# Patient Record
Sex: Female | Born: 1950 | Race: White | Hispanic: No | Marital: Married | Smoking: Never smoker
Health system: Southern US, Community
[De-identification: ages and names within clinical notes are randomized; demographics above are authoritative.]

## PROBLEM LIST (undated history)

## (undated) DIAGNOSIS — E7800 Pure hypercholesterolemia, unspecified: Secondary | ICD-10-CM

## (undated) HISTORY — PX: ABDOMINAL HYSTERECTOMY: SHX81

## (undated) HISTORY — DX: Pure hypercholesterolemia, unspecified: E78.00

---

## 2000-05-13 LAB — HM COLONOSCOPY

## 2004-12-14 LAB — HM COLONOSCOPY

## 2012-06-15 ENCOUNTER — Encounter (INDEPENDENT_AMBULATORY_CARE_PROVIDER_SITE_OTHER): Payer: BC Managed Care – PPO | Admitting: Ophthalmology

## 2012-06-15 DIAGNOSIS — H3540 Unspecified peripheral retinal degeneration: Secondary | ICD-10-CM

## 2012-06-15 DIAGNOSIS — H43819 Vitreous degeneration, unspecified eye: Secondary | ICD-10-CM

## 2012-06-15 DIAGNOSIS — H25049 Posterior subcapsular polar age-related cataract, unspecified eye: Secondary | ICD-10-CM

## 2013-03-05 LAB — HM COLONOSCOPY

## 2020-02-19 ENCOUNTER — Ambulatory Visit (INDEPENDENT_AMBULATORY_CARE_PROVIDER_SITE_OTHER): Payer: Medicare Other | Admitting: Family Medicine

## 2020-02-19 ENCOUNTER — Other Ambulatory Visit: Payer: Self-pay

## 2020-02-19 ENCOUNTER — Encounter: Payer: Self-pay | Admitting: Family Medicine

## 2020-02-19 VITALS — BP 128/73 | HR 73 | Temp 98.4°F | Ht 63.0 in | Wt 140.6 lb

## 2020-02-19 DIAGNOSIS — Z13228 Encounter for screening for other metabolic disorders: Secondary | ICD-10-CM

## 2020-02-19 DIAGNOSIS — Z13220 Encounter for screening for lipoid disorders: Secondary | ICD-10-CM | POA: Diagnosis not present

## 2020-02-19 DIAGNOSIS — Z7689 Persons encountering health services in other specified circumstances: Secondary | ICD-10-CM | POA: Diagnosis not present

## 2020-02-19 DIAGNOSIS — Z1159 Encounter for screening for other viral diseases: Secondary | ICD-10-CM

## 2020-02-19 DIAGNOSIS — N952 Postmenopausal atrophic vaginitis: Secondary | ICD-10-CM

## 2020-02-19 NOTE — Progress Notes (Signed)
Subjective: CC: est care, atrophic vaginitis PCP: Janora Norlander, DO EEF:EOFHQRF Kristine Keller is a 69 y.o. female presenting to clinic today for:  1.  Atrophic vaginitis Patient was formally cared for by Dr. Lindwood Qua, who has since retired.  She is on topical estradiol cream vaginally.  She notes that she takes this once a week and it typically controls urethritis and vaginal discharge fairly well.  She is on no other medications.  She has history of partial hysterectomy which spared her ovaries.  She has history of fibrocystic changes in the breast.   ROS: Per HPI  Allergies  Allergen Reactions  . Codeine Other (See Comments)  . Sulfa Antibiotics Anaphylaxis   History reviewed. No pertinent past medical history.  Current Outpatient Medications:  .  estradiol (ESTRACE) 0.1 MG/GM vaginal cream, FINGER APPLICATION TO THE VAGINA AT BEDTIME AS DIRECTED, Disp: , Rfl:  Social History   Socioeconomic History  . Marital status: Married    Spouse name: Richard  . Number of children: 3  . Years of education: 10  . Highest education level: 10th grade  Occupational History  . Occupation: retired  Tobacco Use  . Smoking status: Never Smoker  . Smokeless tobacco: Never Used  Vaping Use  . Vaping Use: Never used  Substance and Sexual Activity  . Alcohol use: Never  . Drug use: Never  . Sexual activity: Not Currently  Other Topics Concern  . Not on file  Social History Narrative  . Not on file   Social Determinants of Health   Financial Resource Strain:   . Difficulty of Paying Living Expenses:   Food Insecurity:   . Worried About Charity fundraiser in the Last Year:   . Arboriculturist in the Last Year:   Transportation Needs:   . Film/video editor (Medical):   Marland Kitchen Lack of Transportation (Non-Medical):   Physical Activity:   . Days of Exercise per Week:   . Minutes of Exercise per Session:   Stress:   . Feeling of Stress :   Social Connections:   . Frequency of  Communication with Friends and Family:   . Frequency of Social Gatherings with Friends and Family:   . Attends Religious Services:   . Active Member of Clubs or Organizations:   . Attends Archivist Meetings:   Marland Kitchen Marital Status:   Intimate Partner Violence:   . Fear of Current or Ex-Partner:   . Emotionally Abused:   Marland Kitchen Physically Abused:   . Sexually Abused:    Family History  Problem Relation Age of Onset  . Cancer Mother        lung, kidney, cervical  . Hypertension Mother   . COPD Father   . COPD Brother   . Hypertension Brother   . Cancer Brother        kidney  . Autoimmune disease Daughter     Objective: Office vital signs reviewed. BP 128/73   Pulse 73   Temp 98.4 F (36.9 C)   Ht '5\' 3"'  (1.6 m)   Wt 140 lb 9.6 oz (63.8 kg)   SpO2 97%   BMI 24.91 kg/m   Physical Examination:  General: Awake, alert, well nourished, No acute distress HEENT: Normal, sclera white, MMM; no carotid bruits Cardio: regular rate and rhythm, S1S2 heard, no murmurs appreciated Pulm: clear to auscultation bilaterally, no wheezes, rhonchi or rales; normal work of breathing on room air Extremities: warm, well perfused, No edema, cyanosis  or clubbing; +2 pulses bilaterally MSK: normal gait and station Skin: dry; intact; no rashes or lesions Neuro: Alert and oriented x3.  No focal neurologic deficits PSych: Mood stable, speech normal, affect appropriate, pleasant and interactive.  Does not appear to be responding to internal stimuli. Depression screen PHQ 2/9 02/19/2020  Decreased Interest 0  Down, Depressed, Hopeless 0  PHQ - 2 Score 0     Assessment/ Plan: 69 y.o. female   1. Atrophic vaginitis No refills needed of the estrogen cream.  She will contact me if she needs refills going forward  2. Establishing care with new doctor, encounter for  3. Screening for metabolic disorder Fasting labs ordered. - CMP14+EGFR; Future - TSH; Future  4. Screening, lipid - Lipid  panel; Future  5. Encounter for hepatitis C screening test for low risk patient Could not find previous hepatitis C screen in EMR.  Will obtain per CDC guidelines with future labs. - Hepatitis C antibody; Future  No orders of the defined types were placed in this encounter.  No orders of the defined types were placed in this encounter.    Janora Norlander, DO [place] [phone]

## 2020-02-19 NOTE — Patient Instructions (Signed)
Come in fasting for labs.

## 2020-02-21 ENCOUNTER — Other Ambulatory Visit: Payer: Self-pay

## 2020-02-21 ENCOUNTER — Other Ambulatory Visit: Payer: Medicare Other

## 2020-02-21 DIAGNOSIS — Z13220 Encounter for screening for lipoid disorders: Secondary | ICD-10-CM

## 2020-02-21 DIAGNOSIS — Z1159 Encounter for screening for other viral diseases: Secondary | ICD-10-CM

## 2020-02-21 DIAGNOSIS — Z13228 Encounter for screening for other metabolic disorders: Secondary | ICD-10-CM

## 2020-02-22 LAB — CMP14+EGFR
ALT: 19 IU/L (ref 0–32)
AST: 24 IU/L (ref 0–40)
Albumin/Globulin Ratio: 1.7 (ref 1.2–2.2)
Albumin: 4.6 g/dL (ref 3.8–4.8)
Alkaline Phosphatase: 51 IU/L (ref 48–121)
BUN/Creatinine Ratio: 11 — ABNORMAL LOW (ref 12–28)
BUN: 10 mg/dL (ref 8–27)
Bilirubin Total: 0.4 mg/dL (ref 0.0–1.2)
CO2: 26 mmol/L (ref 20–29)
Calcium: 9.7 mg/dL (ref 8.7–10.3)
Chloride: 102 mmol/L (ref 96–106)
Creatinine, Ser: 0.91 mg/dL (ref 0.57–1.00)
GFR calc Af Amer: 75 mL/min/{1.73_m2} (ref >59)
GFR calc non Af Amer: 65 mL/min/{1.73_m2} (ref >59)
Globulin, Total: 2.7 g/dL (ref 1.5–4.5)
Glucose: 90 mg/dL (ref 65–99)
Potassium: 4 mmol/L (ref 3.5–5.2)
Sodium: 139 mmol/L (ref 134–144)
Total Protein: 7.3 g/dL (ref 6.0–8.5)

## 2020-02-22 LAB — LIPID PANEL
Chol/HDL Ratio: 4.3 ratio (ref 0.0–4.4)
Cholesterol, Total: 242 mg/dL — ABNORMAL HIGH (ref 100–199)
HDL: 56 mg/dL (ref >39)
LDL Chol Calc (NIH): 165 mg/dL — ABNORMAL HIGH (ref 0–99)
Triglycerides: 120 mg/dL (ref 0–149)
VLDL Cholesterol Cal: 21 mg/dL (ref 5–40)

## 2020-02-22 LAB — HEPATITIS C ANTIBODY: Hep C Virus Ab: <0.1 s/co ratio (ref 0.0–0.9)

## 2020-02-22 LAB — TSH: TSH: 2.71 u[IU]/mL (ref 0.450–4.500)

## 2020-02-27 ENCOUNTER — Telehealth: Payer: Self-pay | Admitting: Family Medicine

## 2020-02-27 NOTE — Telephone Encounter (Signed)
FYI: Pt called [facility] regarding lab results. Pt says she wants to try lowering her cholesterol on her own before starting medication. Pt says she will start taking fish oil and do better with diet and exercise to see if that will help. Pt scheduled a 3 mth follow up visit with Dr Nadine Counts on 05/07/20.

## 2020-02-28 ENCOUNTER — Other Ambulatory Visit: Payer: Self-pay | Admitting: Family Medicine

## 2020-02-28 DIAGNOSIS — Z1231 Encounter for screening mammogram for malignant neoplasm of breast: Secondary | ICD-10-CM

## 2020-03-26 ENCOUNTER — Ambulatory Visit
Admission: RE | Admit: 2020-03-26 | Discharge: 2020-03-26 | Disposition: A | Discharge status: Home / Self Care | Payer: Medicare Other | Source: Ambulatory Visit | Attending: Family Medicine | Admitting: Family Medicine

## 2020-03-26 ENCOUNTER — Other Ambulatory Visit: Payer: Self-pay

## 2020-03-26 DIAGNOSIS — Z1231 Encounter for screening mammogram for malignant neoplasm of breast: Secondary | ICD-10-CM

## 2020-05-07 ENCOUNTER — Ambulatory Visit: Payer: Medicare Other | Admitting: Family Medicine

## 2021-09-15 ENCOUNTER — Ambulatory Visit (INDEPENDENT_AMBULATORY_CARE_PROVIDER_SITE_OTHER): Payer: Medicare Other

## 2021-09-15 VITALS — Ht 63.0 in | Wt 135.0 lb

## 2021-09-15 DIAGNOSIS — Z0000 Encounter for general adult medical examination without abnormal findings: Secondary | ICD-10-CM

## 2021-09-15 DIAGNOSIS — Z1231 Encounter for screening mammogram for malignant neoplasm of breast: Secondary | ICD-10-CM

## 2021-09-15 NOTE — Progress Notes (Signed)
Subjective:   Kristine Keller is a 71 y.o. female who presents for Medicare Annual (Subsequent) preventive examination.  Virtual Visit via Telephone Note  I connected with  Kristine Keller on 09/15/21 at  9:00 AM EST by telephone and verified that I am speaking with the correct person using two identifiers.  Location: Patient: Home Provider: [facility] Persons participating in the virtual visit: patient/Nurse Health Advisor   I discussed the limitations, risks, security and privacy concerns of performing an evaluation and management service by telephone and the availability of in person appointments. The patient expressed understanding and agreed to proceed.  Interactive audio and video telecommunications were attempted between this nurse and patient, however failed, due to patient having technical difficulties OR patient did not have access to video capability.  We continued and completed visit with audio only.  Some vital signs may be absent or patient reported.   Kristine Keller E Kristine Sweetland, LPN   Review of Systems     Cardiac Risk Factors include: advanced age (>53men, >67 women);dyslipidemia     Objective:    Today's Vitals   09/15/21 0858  Weight: 135 lb (61.2 kg)  Height: 5\' 3"  (1.6 m)   Body mass index is 23.91 kg/m.  Advanced Directives 09/15/2021  Does Patient Have a Medical Advance Directive? Yes  Type of Paramedic of [place];Living will  Copy of [place] in Chart? No - copy requested    Current Medications (verified) Outpatient Encounter Medications as of 09/15/2021  Medication Sig   estradiol (ESTRACE) 0.1 MG/GM vaginal cream FINGER APPLICATION TO THE VAGINA AT BEDTIME AS DIRECTED   No facility-administered encounter medications on file as of 09/15/2021.    Allergies (verified) Codeine and Sulfa antibiotics   History: History reviewed. No pertinent past medical history. Past Surgical History:  Procedure Laterality Date    ABDOMINAL HYSTERECTOMY     Family History  Problem Relation Age of Onset   Cancer Mother        lung, kidney, cervical   Hypertension Mother    COPD Father    COPD Brother    Hypertension Brother    Cancer Brother        kidney   Autoimmune disease Daughter    Social History   Socioeconomic History   Marital status: Married    Spouse name: Kristine Keller   Number of children: 3   Years of education: 10   Highest education level: 10th grade  Occupational History   Occupation: retired  Tobacco Use   Smoking status: Never   Smokeless tobacco: Never  Vaping Use   Vaping Use: Never used  Substance and Sexual Activity   Alcohol use: Never   Drug use: Never   Sexual activity: Not Currently  Other Topics Concern   Not on file  Social History Narrative   Daughter, Kristine Keller   Her grandson and his 2 disabled children live with her (one with epilepsy, other with autism)-born 2016 & 2017   Social Determinants of Health   Financial Resource Strain: Low Risk    Difficulty of Paying Living Expenses: Not hard at all  Food Insecurity: No Food Insecurity   Worried About Charity fundraiser in the Last Year: Never true   Arboriculturist in the Last Year: Never true  Transportation Needs: No Transportation Needs   Lack of Transportation (Medical): No   Lack of Transportation (Non-Medical): No  Physical Activity: Sufficiently Active   Days of Exercise per Week:  5 days   Minutes of Exercise per Session: 30 min  Stress: No Stress Concern Present   Feeling of Stress : Not at all  Social Connections: Moderately Isolated   Frequency of Communication with Friends and Family: More than three times a week   Frequency of Social Gatherings with Friends and Family: Once a week   Attends Religious Services: Never   Marine scientist or Organizations: No   Attends Music therapist: Never   Marital Status: Married    Tobacco Counseling Counseling given: Not Answered   Clinical  Intake:  Pre-visit preparation completed: Yes  Pain : No/denies pain     BMI - recorded: 23.91 Nutritional Status: BMI of 19-24  Normal Nutritional Risks: None Diabetes: No  How often do you need to have someone help you when you read instructions, pamphlets, or other written materials from your doctor or pharmacy?: 1 - Never  Diabetic? no  Interpreter Needed?: No  Information entered by :: Gurshan Settlemire, LPN   Activities of Daily Living In your present state of health, do you have any difficulty performing the following activities: 09/15/2021  Hearing? N  Vision? N  Difficulty concentrating or making decisions? N  Walking or climbing stairs? N  Dressing or bathing? N  Doing errands, shopping? N  Preparing Food and eating ? N  Using the Toilet? N  In the past six months, have you accidently leaked urine? N  Do you have problems with loss of bowel control? N  Managing your Medications? N  Managing your Finances? N  Housekeeping or managing your Housekeeping? N  Some recent data might be hidden    Patient Care Team: Janora Norlander, DO as PCP - General (Family Medicine)  Indicate any recent Medical Services you may have received from other than [facility] providers in the past year (date may be approximate).     Assessment:   This is a routine wellness examination for Kristine Keller.  Hearing/Vision screen Hearing Screening - Comments:: Denies hearing difficulties  Vision Screening - Comments:: Wears rx glasses- up to date with annual eye exams with Kristine Keller at [facility] issues and exercise activities discussed: Current Exercise Habits: Home exercise routine, Type of exercise: walking;Other - see comments (stays busy and active caring for great grandchildren), Time (Minutes): 30, Frequency (Times/Week): 5, Weekly Exercise (Minutes/Week): 150, Intensity: Mild, Exercise limited by: None identified   Goals Addressed             This Visit's Progress     Exercise 150 min/wk Moderate Activity         Depression Screen PHQ 2/9 Scores 09/15/2021 02/19/2020  PHQ - 2 Score 0 0    Fall Risk Fall Risk  09/15/2021 02/19/2020  Falls in the past year? 0 0  Number falls in past yr: 0 -  Injury with Fall? 0 -  Risk for fall due to : No Fall Risks -  Follow up Falls prevention discussed -    FALL [place]:  Any stairs in or around the home? Yes  If so, are there any without handrails? No  Home free of loose throw rugs in walkways, pet beds, electrical cords, etc? Yes  Adequate lighting in your home to reduce risk of falls? Yes   ASSISTIVE DEVICES UTILIZED TO PREVENT FALLS:  Life alert? No  Use of a cane, walker or w/c? No  Grab bars in the bathroom? No  Shower chair  or bench in shower? No  Elevated toilet seat or a handicapped toilet? No   TIMED UP AND GO:  Was the test performed? No . Telephonic visit.  Cognitive Function:     6CIT Screen 09/15/2021  What Year? 0 points  What month? 0 points  What time? 0 points  Count back from 20 0 points  Months in reverse 0 points  Repeat phrase 0 points  Total Score 0    Immunizations Immunization History  Administered Date(s) Administered   Unspecified SARS-COV-2 Vaccination 06/30/2020, 07/28/2020    TDAP status: Due, Education has been provided regarding the importance of this vaccine. Advised may receive this vaccine at local pharmacy or Health Dept. Aware to provide a copy of the vaccination record if obtained from local pharmacy or Health Dept. Verbalized acceptance and understanding.  Flu Vaccine status: Declined, Education has been provided regarding the importance of this vaccine but patient still declined. Advised may receive this vaccine at local pharmacy or Health Dept. Aware to provide a copy of the vaccination record if obtained from local pharmacy or Health Dept. Verbalized acceptance and understanding.  Pneumococcal vaccine status: Declined,   Education has been provided regarding the importance of this vaccine but patient still declined. Advised may receive this vaccine at local pharmacy or Health Dept. Aware to provide a copy of the vaccination record if obtained from local pharmacy or Health Dept. Verbalized acceptance and understanding.   Covid-19 vaccine status: Completed vaccines  Qualifies for Shingles Vaccine? Yes   Zostavax completed No   Shingrix Completed?: No.    Education has been provided regarding the importance of this vaccine. Patient has been advised to call insurance company to determine out of pocket expense if they have not yet received this vaccine. Advised may also receive vaccine at local pharmacy or Health Dept. Verbalized acceptance and understanding.  Screening Tests Health Maintenance  Topic Date Due   TETANUS/TDAP  Never done   COLONOSCOPY (Pts 45-37yrs Insurance coverage will need to be confirmed)  Never done   Zoster Vaccines- Shingrix (1 of 2) Never done   Pneumonia Vaccine 35+ Years old (1 - PCV) Never done   DEXA SCAN  Never done   COVID-19 Vaccine (3 - Booster) 09/22/2020   INFLUENZA VACCINE  Never done   MAMMOGRAM  03/26/2022   Hepatitis C Screening  Completed   HPV VACCINES  Aged Out    Health Maintenance  Health Maintenance Due  Topic Date Due   TETANUS/TDAP  Never done   COLONOSCOPY (Pts 45-45yrs Insurance coverage will need to be confirmed)  Never done   Zoster Vaccines- Shingrix (1 of 2) Never done   Pneumonia Vaccine 63+ Years old (1 - PCV) Never done   DEXA SCAN  Never done   COVID-19 Vaccine (3 - Booster) 09/22/2020   INFLUENZA VACCINE  Never done    Colorectal cancer screening: due - will discuss with PCP  Mammogram status: Ordered 08/2021. Pt provided with contact info and advised to call to schedule appt.   Bone Density scan: Due - will discuss with PCP  Lung Cancer Screening: (Low Dose CT Chest recommended if Age 59-80 years, 30 pack-year currently smoking OR have  quit w/in 15years.) does not qualify  Additional Screening:  Hepatitis C Screening: does qualify; Completed 02/21/2020  Vision Screening: Recommended annual ophthalmology exams for early detection of glaucoma and other disorders of the eye. Is the patient up to date with their annual eye exam?  Yes  Who is  the provider or what is the name of the office in which the patient attends annual eye exams? Kristine Keller If pt is not established with a provider, would they like to be referred to a provider to establish care? No .   Dental Screening: Recommended annual dental exams for proper oral hygiene  Community Resource Referral / Chronic Care Management: CRR required this visit?  No   CCM required this visit?  No      Plan:     I have personally reviewed and noted the following in the patients chart:   Medical and social history Use of alcohol, tobacco or illicit drugs  Current medications and supplements including opioid prescriptions.  Functional ability and status Nutritional status Physical activity Advanced directives List of other physicians Hospitalizations, surgeries, and ER visits in previous 12 months Vitals Screenings to include cognitive, depression, and falls Referrals and appointments  In addition, I have reviewed and discussed with patient certain preventive protocols, quality metrics, and best practice recommendations. A written personalized care plan for preventive services as well as general preventive health recommendations were provided to patient.     Sandrea Hammond, LPN   [id number]   Nurse Notes: Is past due for several vaccins and screenings - says she will discuss and possibly consider at next visit.

## 2021-09-15 NOTE — Patient Instructions (Signed)
Kristine Keller , Thank you for taking time to come for your Medicare Wellness Visit. I appreciate your ongoing commitment to your health goals. Please review the following plan we discussed and let me know if I can assist you in the future.   Screening recommendations/referrals: Colonoscopy: due Mammogram: Done 03/26/2020 - Repeat annually *due - call for appointment in February Bone Density: Due Recommended yearly ophthalmology/optometry visit for glaucoma screening and checkup Recommended yearly dental visit for hygiene and checkup  Vaccinations: Influenza vaccine: Due Pneumococcal vaccine: Due Tdap vaccine: Due Shingles vaccine: Due   Covid-19: Done x2 06/2020  Advanced directives: Please bring a copy of your health care power of attorney and living will to the office to be added to your chart at your convenience.   Conditions/risks identified: Aim for 30 minutes of exercise or brisk walking each day, drink 6-8 glasses of water and eat lots of fruits and vegetables.   Next appointment: Follow up in one year for your annual wellness visit    Preventive Care 65 Years and Older, Female Preventive care refers to lifestyle choices and visits with your health care provider that can promote health and wellness. What does preventive care include? A yearly physical exam. This is also called an annual well check. Dental exams once or twice a year. Routine eye exams. Ask your health care provider how often you should have your eyes checked. Personal lifestyle choices, including: Daily care of your teeth and gums. Regular physical activity. Eating a healthy diet. Avoiding tobacco and drug use. Limiting alcohol use. Practicing safe sex. Taking low-dose aspirin every day. Taking vitamin and mineral supplements as recommended by your health care provider. What happens during an annual well check? The services and screenings done by your health care provider during your annual well check will  depend on your age, overall health, lifestyle risk factors, and family history of disease. Counseling  Your health care provider may ask you questions about your: Alcohol use. Tobacco use. Drug use. Emotional well-being. Home and relationship well-being. Sexual activity. Eating habits. History of falls. Memory and ability to understand (cognition). Work and work Astronomer. Reproductive health. Screening  You may have the following tests or measurements: Height, weight, and BMI. Blood pressure. Lipid and cholesterol levels. These may be checked every 5 years, or more frequently if you are over 37 years old. Skin check. Lung cancer screening. You may have this screening every year starting at age 35 if you have a 30-pack-year history of smoking and currently smoke or have quit within the past 15 years. Fecal occult blood test (FOBT) of the stool. You may have this test every year starting at age 56. Flexible sigmoidoscopy or colonoscopy. You may have a sigmoidoscopy every 5 years or a colonoscopy every 10 years starting at age 76. Hepatitis C blood test. Hepatitis B blood test. Sexually transmitted disease (STD) testing. Diabetes screening. This is done by checking your blood sugar (glucose) after you have not eaten for a while (fasting). You may have this done every 1-3 years. Bone density scan. This is done to screen for osteoporosis. You may have this done starting at age 50. Mammogram. This may be done every 1-2 years. Talk to your health care provider about how often you should have regular mammograms. Talk with your health care provider about your test results, treatment options, and if necessary, the need for more tests. Vaccines  Your health care provider may recommend certain vaccines, such as: Influenza vaccine. This is recommended  every year. Tetanus, diphtheria, and acellular pertussis (Tdap, Td) vaccine. You may need a Td booster every 10 years. Zoster vaccine. You may  need this after age 62. Pneumococcal 13-valent conjugate (PCV13) vaccine. One dose is recommended after age 56. Pneumococcal polysaccharide (PPSV23) vaccine. One dose is recommended after age 6. Talk to your health care provider about which screenings and vaccines you need and how often you need them. This information is not intended to replace advice given to you by your health care provider. Make sure you discuss any questions you have with your health care provider. Document Released: 09/12/2015 Document Revised: 05/05/2016 Document Reviewed: 06/17/2015 Elsevier Interactive Patient Education  2017 [place] Prevention in the Home Falls can cause injuries. They can happen to people of all ages. There are many things you can do to make your home safe and to help prevent falls. What can I do on the outside of my home? Regularly fix the edges of walkways and driveways and fix any cracks. Remove anything that might make you trip as you walk through a door, such as a raised step or threshold. Trim any bushes or trees on the path to your home. Use bright outdoor lighting. Clear any walking paths of anything that might make someone trip, such as rocks or tools. Regularly check to see if handrails are loose or broken. Make sure that both sides of any steps have handrails. Any raised decks and porches should have guardrails on the edges. Have any leaves, snow, or ice cleared regularly. Use sand or salt on walking paths during winter. Clean up any spills in your garage right away. This includes oil or grease spills. What can I do in the bathroom? Use night lights. Install grab bars by the toilet and in the tub and shower. Do not use towel bars as grab bars. Use non-skid mats or decals in the tub or shower. If you need to sit down in the shower, use a plastic, non-slip stool. Keep the floor dry. Clean up any water that spills on the floor as soon as it happens. Remove soap buildup in  the tub or shower regularly. Attach bath mats securely with double-sided non-slip rug tape. Do not have throw rugs and other things on the floor that can make you trip. What can I do in the bedroom? Use night lights. Make sure that you have a light by your bed that is easy to reach. Do not use any sheets or blankets that are too big for your bed. They should not hang down onto the floor. Have a firm chair that has side arms. You can use this for support while you get dressed. Do not have throw rugs and other things on the floor that can make you trip. What can I do in the kitchen? Clean up any spills right away. Avoid walking on wet floors. Keep items that you use a lot in easy-to-reach places. If you need to reach something above you, use a strong step stool that has a grab bar. Keep electrical cords out of the way. Do not use floor polish or wax that makes floors slippery. If you must use wax, use non-skid floor wax. Do not have throw rugs and other things on the floor that can make you trip. What can I do with my stairs? Do not leave any items on the stairs. Make sure that there are handrails on both sides of the stairs and use them. Fix handrails that are broken  or loose. Make sure that handrails are as long as the stairways. Check any carpeting to make sure that it is firmly attached to the stairs. Fix any carpet that is loose or worn. Avoid having throw rugs at the top or bottom of the stairs. If you do have throw rugs, attach them to the floor with carpet tape. Make sure that you have a light switch at the top of the stairs and the bottom of the stairs. If you do not have them, ask someone to add them for you. What else can I do to help prevent falls? Wear shoes that: Do not have high heels. Have rubber bottoms. Are comfortable and fit you well. Are closed at the toe. Do not wear sandals. If you use a stepladder: Make sure that it is fully opened. Do not climb a closed  stepladder. Make sure that both sides of the stepladder are locked into place. Ask someone to hold it for you, if possible. Clearly mark and make sure that you can see: Any grab bars or handrails. First and last steps. Where the edge of each step is. Use tools that help you move around (mobility aids) if they are needed. These include: Canes. Walkers. Scooters. Crutches. Turn on the lights when you go into a dark area. Replace any light bulbs as soon as they burn out. Set up your furniture so you have a clear path. Avoid moving your furniture around. If any of your floors are uneven, fix them. If there are any pets around you, be aware of where they are. Review your medicines with your doctor. Some medicines can make you feel dizzy. This can increase your chance of falling. Ask your doctor what other things that you can do to help prevent falls. This information is not intended to replace advice given to you by your health care provider. Make sure you discuss any questions you have with your health care provider. Document Released: 06/12/2009 Document Revised: 01/22/2016 Document Reviewed: 09/20/2014 Elsevier Interactive Patient Education  2017 [employer].

## 2021-09-28 ENCOUNTER — Telehealth: Payer: Self-pay | Admitting: Family Medicine

## 2021-09-28 ENCOUNTER — Other Ambulatory Visit: Payer: Self-pay | Admitting: Family Medicine

## 2021-09-28 DIAGNOSIS — E7800 Pure hypercholesterolemia, unspecified: Secondary | ICD-10-CM

## 2021-09-28 NOTE — Telephone Encounter (Signed)
done

## 2021-09-28 NOTE — Telephone Encounter (Signed)
PT AWARE LABS ORDERED

## 2021-10-19 ENCOUNTER — Other Ambulatory Visit: Payer: Medicare Other

## 2021-10-19 DIAGNOSIS — E7800 Pure hypercholesterolemia, unspecified: Secondary | ICD-10-CM | POA: Diagnosis not present

## 2021-10-20 LAB — LIPID PANEL
Chol/HDL Ratio: 4.2 ratio (ref 0.0–4.4)
Cholesterol, Total: 230 mg/dL — ABNORMAL HIGH (ref 100–199)
HDL: 55 mg/dL (ref >39)
LDL Chol Calc (NIH): 156 mg/dL — ABNORMAL HIGH (ref 0–99)
Triglycerides: 106 mg/dL (ref 0–149)
VLDL Cholesterol Cal: 19 mg/dL (ref 5–40)

## 2021-10-20 LAB — CMP14+EGFR
ALT: 16 IU/L (ref 0–32)
AST: 24 IU/L (ref 0–40)
Albumin/Globulin Ratio: 1.7 (ref 1.2–2.2)
Albumin: 4.3 g/dL (ref 3.8–4.8)
Alkaline Phosphatase: 49 IU/L (ref 44–121)
BUN/Creatinine Ratio: 13 (ref 12–28)
BUN: 11 mg/dL (ref 8–27)
Bilirubin Total: 0.5 mg/dL (ref 0.0–1.2)
CO2: 24 mmol/L (ref 20–29)
Calcium: 9.4 mg/dL (ref 8.7–10.3)
Chloride: 104 mmol/L (ref 96–106)
Creatinine, Ser: 0.87 mg/dL (ref 0.57–1.00)
Globulin, Total: 2.5 g/dL (ref 1.5–4.5)
Glucose: 91 mg/dL (ref 70–99)
Potassium: 4.1 mmol/L (ref 3.5–5.2)
Sodium: 141 mmol/L (ref 134–144)
Total Protein: 6.8 g/dL (ref 6.0–8.5)
eGFR: 72 mL/min/{1.73_m2} (ref >59)

## 2021-10-20 LAB — TSH: TSH: 1.85 u[IU]/mL (ref 0.450–4.500)

## 2021-10-21 ENCOUNTER — Ambulatory Visit (INDEPENDENT_AMBULATORY_CARE_PROVIDER_SITE_OTHER): Payer: Medicare Other | Admitting: Family Medicine

## 2021-10-21 ENCOUNTER — Encounter: Payer: Self-pay | Admitting: Family Medicine

## 2021-10-21 ENCOUNTER — Ambulatory Visit
Admission: RE | Admit: 2021-10-21 | Discharge: 2021-10-21 | Disposition: A | Discharge status: Home / Self Care | Payer: Medicare Other | Source: Ambulatory Visit | Attending: Family Medicine | Admitting: Family Medicine

## 2021-10-21 ENCOUNTER — Other Ambulatory Visit: Payer: Self-pay

## 2021-10-21 VITALS — BP 145/86 | HR 81 | Temp 98.0°F | Ht 63.0 in | Wt 141.6 lb

## 2021-10-21 DIAGNOSIS — Z5320 Procedure and treatment not carried out because of patient's decision for unspecified reasons: Secondary | ICD-10-CM | POA: Diagnosis not present

## 2021-10-21 DIAGNOSIS — Z1231 Encounter for screening mammogram for malignant neoplasm of breast: Secondary | ICD-10-CM | POA: Diagnosis not present

## 2021-10-21 DIAGNOSIS — Z2821 Immunization not carried out because of patient refusal: Secondary | ICD-10-CM

## 2021-10-21 DIAGNOSIS — R030 Elevated blood-pressure reading, without diagnosis of hypertension: Secondary | ICD-10-CM | POA: Diagnosis not present

## 2021-10-21 DIAGNOSIS — E7800 Pure hypercholesterolemia, unspecified: Secondary | ICD-10-CM

## 2021-10-21 DIAGNOSIS — Z0000 Encounter for general adult medical examination without abnormal findings: Secondary | ICD-10-CM

## 2021-10-21 NOTE — Progress Notes (Signed)
Kristine Keller is a 71 y.o. female presents to office today for annual physical exam examination.    Concerns today include: 1. None  Diet: Balanced, Exercise: Stays active.  Has 2 great-grandchildren that reside with her with special needs Last colonoscopy: declines Last mammogram: declines Last pap smear: n/a Refills needed today: none Immunizations needed: Immunization History  Administered Date(s) Administered   Unspecified SARS-COV-2 Vaccination 06/30/2020, 07/28/2020   History reviewed. No pertinent past medical history. Social History   Socioeconomic History   Marital status: Married    Spouse name: Richard   Number of children: 3   Years of education: 10   Highest education level: 10th grade  Occupational History   Occupation: retired  Tobacco Use   Smoking status: Never   Smokeless tobacco: Never  Vaping Use   Vaping Use: Never used  Substance and Sexual Activity   Alcohol use: Never   Drug use: Never   Sexual activity: Not Currently  Other Topics Concern   Not on file  Social History Narrative   Daughter, Judeen Hammans   Her grandson and his 2 disabled children live with her (one with epilepsy, other with autism)-born 2016 & 2017   Social Determinants of Health   Financial Resource Strain: Low Risk    Difficulty of Paying Living Expenses: Not hard at all  Food Insecurity: No Food Insecurity   Worried About Charity fundraiser in the Last Year: Never true   Arboriculturist in the Last Year: Never true  Transportation Needs: No Transportation Needs   Lack of Transportation (Medical): No   Lack of Transportation (Non-Medical): No  Physical Activity: Sufficiently Active   Days of Exercise per Week: 5 days   Minutes of Exercise per Session: 30 min  Stress: No Stress Concern Present   Feeling of Stress : Not at all  Social Connections: Moderately Isolated   Frequency of Communication with Friends and Family: More than three times a week   Frequency of Social  Gatherings with Friends and Family: Once a week   Attends Religious Services: Never   Marine scientist or Organizations: No   Attends Music therapist: Never   Marital Status: Married  Human resources officer Violence: Not At Risk   Fear of Current or Ex-Partner: No   Emotionally Abused: No   Physically Abused: No   Sexually Abused: No   Past Surgical History:  Procedure Laterality Date   ABDOMINAL HYSTERECTOMY     Family History  Problem Relation Age of Onset   Cancer Mother        lung, kidney, cervical   Hypertension Mother    COPD Father    COPD Brother    Hypertension Brother    Cancer Brother        kidney   Autoimmune disease Daughter     Current Outpatient Medications:    estradiol (ESTRACE) 0.1 MG/GM vaginal cream, FINGER APPLICATION TO THE VAGINA AT BEDTIME AS DIRECTED (Patient not taking: Reported on 10/21/2021), Disp: , Rfl:   Allergies  Allergen Reactions   Codeine Other (See Comments)   Sulfa Antibiotics Anaphylaxis     ROS: Review of Systems A comprehensive review of systems was negative.    Physical exam BP (!) 145/86    Pulse 81    Temp 98 F (36.7 C)    Ht '5\' 3"'  (1.6 m)    Wt 141 lb 9.6 oz (64.2 kg)    SpO2 96%  BMI 25.08 kg/m  General appearance: alert, cooperative, appears stated age, and no distress Head: Normocephalic, without obvious abnormality, atraumatic Eyes: negative findings: lids and lashes normal, conjunctivae and sclerae normal, corneas clear, and pupils equal, round, reactive to light and accomodation Ears: normal TM's and external ear canals both ears Nose: Nares normal. Septum midline. Mucosa normal. No drainage or sinus tenderness. Throat:  Oropharynx without erythema or masses Neck: no adenopathy, no carotid bruit, supple, symmetrical, trachea midline, and thyroid not enlarged, symmetric, no tenderness/mass/nodules Back: symmetric, no curvature. ROM normal. No CVA tenderness. Lungs: clear to auscultation  bilaterally Heart: regular rate and rhythm, S1, S2 normal, no murmur, click, rub or gallop Abdomen: soft, non-tender; bowel sounds normal; no masses,  no organomegaly Extremities: extremities normal, atraumatic, no cyanosis or edema Pulses: 2+ and symmetric Skin: Skin color, texture, turgor normal. No rashes or lesions Lymph nodes: Cervical, supraclavicular, and axillary nodes normal. Neurologic: Alert and oriented X 3, normal strength and tone. Normal symmetric reflexes. Normal coordination and gait Psych: Mood stable, speech normal, affect appropriate  The 10-year ASCVD risk score (Arnett DK, et al., 2019) is: 11.3%   Values used to calculate the score:     Age: 7 years     Sex: Female     Is Non-Hispanic African American: No     Diabetic: No     Tobacco smoker: No     Systolic Blood Pressure: 585 mmHg     Is BP treated: No     HDL Cholesterol: 55 mg/dL     Total Cholesterol: 230 mg/dL  Results for orders placed or performed in visit on 10/19/21 (from the past 72 hour(s))  TSH     Status: None   Collection Time: 10/19/21  8:31 AM  Result Value Ref Range   TSH 1.850 0.450 - 4.500 uIU/mL  Lipid panel     Status: Abnormal   Collection Time: 10/19/21  8:31 AM  Result Value Ref Range   Cholesterol, Total 230 (H) 100 - 199 mg/dL   Triglycerides 106 0 - 149 mg/dL   HDL 55 >39 mg/dL   VLDL Cholesterol Cal 19 5 - 40 mg/dL   LDL Chol Calc (NIH) 156 (H) 0 - 99 mg/dL   Chol/HDL Ratio 4.2 0.0 - 4.4 ratio    Comment:                                   T. Chol/HDL Ratio                                             Men  Women                               1/2 Avg.Risk  3.4    3.3                                   Avg.Risk  5.0    4.4                                2X Avg.Risk  9.6    7.1  3X Avg.Risk 23.4   11.0   CMP14+EGFR     Status: None   Collection Time: 10/19/21  8:31 AM  Result Value Ref Range   Glucose 91 70 - 99 mg/dL   BUN 11 8 - 27 mg/dL    Creatinine, Ser 0.87 0.57 - 1.00 mg/dL   eGFR 72 >59 mL/min/1.73   BUN/Creatinine Ratio 13 12 - 28   Sodium 141 134 - 144 mmol/L   Potassium 4.1 3.5 - 5.2 mmol/L   Chloride 104 96 - 106 mmol/L   CO2 24 20 - 29 mmol/L   Calcium 9.4 8.7 - 10.3 mg/dL   Total Protein 6.8 6.0 - 8.5 g/dL   Albumin 4.3 3.8 - 4.8 g/dL   Globulin, Total 2.5 1.5 - 4.5 g/dL   Albumin/Globulin Ratio 1.7 1.2 - 2.2   Bilirubin Total 0.5 0.0 - 1.2 mg/dL   Alkaline Phosphatase 49 44 - 121 IU/L   AST 24 0 - 40 IU/L   ALT 16 0 - 32 IU/L     Assessment/ Plan: Benard Rink here for annual physical exam.   Pure hypercholesterolemia  Annual physical exam  Elevated blood pressure reading without diagnosis of hypertension  Colonoscopy refused  Vaccination refused by patient  ASCVD risk score is 11.3%.  Technically she does warrant cholesterol medication but I do wonder if this would be much reduced if her blood pressure was within normal range.  Her blood pressure was elevated today but I think this is secondary to anxiety surrounding mammogram.  She has that scheduled right after our appointment today.  I have asked that she return in 1 week for blood pressure recheck.  We will recalculate her ASCVD risk work at that time and if persistently above 7.5%, we need to strongly consider starting a statin or similar  She declines shingles vaccination, colonoscopy, pneumococcal vaccination and bone density scan.  Labs review. Patient given results already.   Patient to follow up in 1 year for annual exam or sooner if needed.  Eliab Closson M. Lajuana Ripple, DO

## 2021-10-21 NOTE — Patient Instructions (Signed)
The 10-year ASCVD risk score (Arnett DK, et al., 2019) is: 11.3%   Values used to calculate the score:     Age: 71 years     Sex: Female     Is Non-Hispanic African American: No     Diabetic: No     Tobacco smoker: No     Systolic Blood Pressure: 145 mmHg     Is BP treated: No     HDL Cholesterol: 55 mg/dL     Total Cholesterol: 230 mg/dL   Your 98-XQJJ risk of a stroke or heart attack is 11.3% based on today's blood pressure and your cholesterol levels.  I would like you to come back for a blood pressure recheck with the nurse next week or 2 and we can recalculate this.  Based on today's risk score you should be on a cholesterol medication but I do wonder if lowering the blood pressure would negate that needed.

## 2021-10-28 ENCOUNTER — Ambulatory Visit: Payer: Medicare Other

## 2022-07-13 ENCOUNTER — Telehealth: Payer: Self-pay | Admitting: Family Medicine

## 2022-07-13 ENCOUNTER — Other Ambulatory Visit: Payer: Self-pay | Admitting: Family Medicine

## 2022-07-13 DIAGNOSIS — N952 Postmenopausal atrophic vaginitis: Secondary | ICD-10-CM

## 2022-07-13 MED ORDER — ESTRADIOL 0.1 MG/GM VA CREA: TOPICAL_CREAM | VAGINAL | 99 refills | Status: DC

## 2022-07-13 NOTE — Telephone Encounter (Signed)
No problem-  Sent.

## 2022-07-13 NOTE — Telephone Encounter (Signed)
  Prescription Request  07/13/2022   What is the name of the medication or equipment? Estradiol Cream  Have you contacted your pharmacy to request a refill? YES  Which pharmacy would you like this sent to? [facility]  Says pharmacy has tried sending several requests to Dr Nadine Counts to refill this Rx. Says another doctor is who initially prescribed it but was told by Dr Nadine Counts that if she ever needed refills on it, to call and let her know and she would send in refills.

## 2022-07-13 NOTE — Telephone Encounter (Signed)
Pt aware.

## 2022-08-02 ENCOUNTER — Encounter: Payer: Self-pay | Admitting: Family Medicine

## 2022-08-02 ENCOUNTER — Other Ambulatory Visit (INDEPENDENT_AMBULATORY_CARE_PROVIDER_SITE_OTHER): Payer: Medicare Other

## 2022-08-02 ENCOUNTER — Ambulatory Visit (INDEPENDENT_AMBULATORY_CARE_PROVIDER_SITE_OTHER): Payer: Medicare Other | Admitting: Family Medicine

## 2022-08-02 DIAGNOSIS — M79632 Pain in left forearm: Secondary | ICD-10-CM

## 2022-08-02 DIAGNOSIS — M79645 Pain in left finger(s): Secondary | ICD-10-CM | POA: Diagnosis not present

## 2022-08-02 DIAGNOSIS — M7989 Other specified soft tissue disorders: Secondary | ICD-10-CM

## 2022-08-02 NOTE — Progress Notes (Signed)
Subjective:    Patient ID: Kristine Keller, female    DOB: 1951/07/16, 71 y.o.   MRN: [id number]   HPI: Kristine Keller is a 71 y.o. female presenting for falling 2 weeks ago. Hit arm and wrist on floor. Pain persists.  Left thumb hurts. There is a knot on the left arm. Hurts a lot to pick up things. Numbness  and weakness not present. 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      10/21/2021    8:42 AM 09/15/2021    9:03 AM 02/19/2020    1:56 PM  Depression screen PHQ 2/9  Decreased Interest 0 0 0  Down, Depressed, Hopeless 0 0 0  PHQ - 2 Score 0 0 0     Relevant past medical, surgical, family and social history reviewed and updated as indicated.  Interim medical history since our last visit reviewed. Allergies and medications reviewed and updated.  ROS:  Review of Systems  noncontributory Social History   Tobacco Use  Smoking Status Never  Smokeless Tobacco Never       Objective:     Wt Readings from Last 3 Encounters:  10/21/21 141 lb 9.6 oz (64.2 kg)  09/15/21 135 lb (61.2 kg)  02/19/20 140 lb 9.6 oz (63.8 kg)     Exam deferred. Pt. Harboring due to COVID 19. Phone visit performed.   Assessment & Plan:   1. Pain and swelling of left forearm   2. Pain of left thumb     No orders of the defined types were placed in this encounter.   Orders Placed This Encounter  Procedures   DG Forearm Left    Standing Status:   Future    Standing Expiration Date:   09/02/2022    Order Specific Question:   Reason for Exam (SYMPTOM  OR DIAGNOSIS REQUIRED)    Answer:   pain 2 weeks after a fall    Order Specific Question:   Preferred imaging location?    Answer:   Internal   DG Hand Complete Left    Standing Status:   Future    Standing Expiration Date:   09/02/2022    Order Specific Question:   Reason for Exam (SYMPTOM  OR DIAGNOSIS REQUIRED)    Answer:   pain 2 weeks after a fall    Order Specific Question:   Preferred imaging location?    Answer:   Internal       Diagnoses and all orders for this visit:  Pain and swelling of left forearm -     DG Forearm Left; Future -     DG Hand Complete Left; Future  Pain of left thumb -     DG Forearm Left; Future -     DG Hand Complete Left; Future    Virtual Visit via telephone Note  I discussed the limitations, risks, security and privacy concerns of performing an evaluation and management service by telephone and the availability of in person appointments. The patient was identified with two identifiers. Pt.expressed understanding and agreed to proceed. Pt. Is at home. Dr. Darlyn Read is in his office.  Follow Up Instructions:   I discussed the assessment and treatment plan with the patient. The patient was provided an opportunity to ask questions and all were answered. The patient agreed with the plan and demonstrated an understanding of the instructions.   The patient was advised to call back or seek an in-person evaluation if the symptoms worsen or  if the condition fails to improve as anticipated.   Total minutes including chart review and phone contact time: 7   Follow up plan: Return if symptoms worsen or fail to improve.  Claretta Fraise, MD [place]

## 2022-09-16 ENCOUNTER — Ambulatory Visit (INDEPENDENT_AMBULATORY_CARE_PROVIDER_SITE_OTHER): Payer: Medicare Other

## 2022-09-16 VITALS — Ht 63.0 in | Wt 133.0 lb

## 2022-09-16 DIAGNOSIS — Z0000 Encounter for general adult medical examination without abnormal findings: Secondary | ICD-10-CM | POA: Diagnosis not present

## 2022-09-16 NOTE — Progress Notes (Signed)
Subjective:   Kristine Keller is a 72 y.o. female who presents for Medicare Annual (Subsequent) preventive examination. I connected with  Kristine Keller on 09/16/22 by a audio enabled telemedicine application and verified that I am speaking with the correct person using two identifiers.  Patient Location: Home  Provider Location: Home Office  I discussed the limitations of evaluation and management by telemedicine. The patient expressed understanding and agreed to proceed.  Review of Systems     Cardiac Risk Factors include: advanced age (>58men, >56 women)     Objective:    Today's Vitals   09/16/22 0858  Weight: 133 lb (60.3 kg)  Height: 5\' 3"  (1.6 m)   Body mass index is 23.56 kg/m.     09/16/2022    9:03 AM 09/15/2021    9:08 AM  Advanced Directives  Does Patient Have a Medical Advance Directive? Yes Yes  Type of Paramedic of [place];Living will [place];Living will  Copy of [place] in Chart? No - copy requested No - copy requested    Current Medications (verified) Outpatient Encounter Medications as of 09/16/2022  Medication Sig   estradiol (ESTRACE) 0.1 MG/GM vaginal cream FINGER APPLICATION TO THE VAGINA AT BEDTIME AS DIRECTED   No facility-administered encounter medications on file as of 09/16/2022.    Allergies (verified) Codeine and Sulfa antibiotics   History: History reviewed. No pertinent past medical history. Past Surgical History:  Procedure Laterality Date   ABDOMINAL HYSTERECTOMY     Family History  Problem Relation Age of Onset   Cancer Mother        lung, kidney, cervical   Hypertension Mother    COPD Father    COPD Brother    Hypertension Brother    Cancer Brother        kidney   Autoimmune disease Daughter    Social History   Socioeconomic History   Marital status: Married    Spouse name: Richard   Number of children: 3   Years of education: 10   Highest  education level: 10th grade  Occupational History   Occupation: retired  Tobacco Use   Smoking status: Never   Smokeless tobacco: Never  Vaping Use   Vaping Use: Never used  Substance and Sexual Activity   Alcohol use: Never   Drug use: Never   Sexual activity: Not Currently  Other Topics Concern   Not on file  Social History Narrative   Daughter, Judeen Hammans   Her grandson and his 2 disabled children live with her (one with epilepsy, other with autism)-born 2016 & 2017   Social Determinants of Health   Financial Resource Strain: Low Risk  (09/16/2022)   Overall Financial Resource Strain (CARDIA)    Difficulty of Paying Living Expenses: Not hard at all  Food Insecurity: No Food Insecurity (09/16/2022)   Hunger Vital Sign    Worried About Running Out of Food in the Last Year: Never true    [place] in the Last Year: Never true  Transportation Needs: No Transportation Needs (09/16/2022)   PRAPARE - Hydrologist (Medical): No    Lack of Transportation (Non-Medical): No  Physical Activity: Sufficiently Active (09/16/2022)   Exercise Vital Sign    Days of Exercise per Week: 5 days    Minutes of Exercise per Session: 30 min  Stress: No Stress Concern Present (09/16/2022)   [place]  Questionnaire    Feeling of Stress : Not at all  Social Connections: Moderately Isolated (09/16/2022)   Social Connection and Isolation Panel [NHANES]    Frequency of Communication with Friends and Family: More than three times a week    Frequency of Social Gatherings with Friends and Family: More than three times a week    Attends Religious Services: Never    Database administrator or Organizations: No    Attends Engineer, structural: Never    Marital Status: Married    Tobacco Counseling Counseling given: Not Answered   Clinical Intake:  Pre-visit preparation completed: Yes  Pain : No/denies pain      Nutritional Risks: None Diabetes: No  How often do you need to have someone help you when you read instructions, pamphlets, or other written materials from your doctor or pharmacy?: 1 - Never  Diabetic?no   Interpreter Needed?: No  Information entered by :: Renie Ora, LPN   Activities of Daily Living    09/16/2022    9:03 AM  In your present state of health, do you have any difficulty performing the following activities:  Hearing? 0  Vision? 0  Difficulty concentrating or making decisions? 0  Walking or climbing stairs? 0  Dressing or bathing? 0  Doing errands, shopping? 0  Preparing Food and eating ? N  Using the Toilet? N  In the past six months, have you accidently leaked urine? N  Do you have problems with loss of bowel control? N  Managing your Medications? N  Managing your Finances? N  Housekeeping or managing your Housekeeping? N    Patient Care Team: Raliegh Ip, DO as PCP - General (Family Medicine)  Indicate any recent Medical Services you may have received from other than [facility] providers in the past year (date may be approximate).     Assessment:   This is a routine wellness examination for [place].  Hearing/Vision screen Vision Screening - Comments:: Wears rx glasses - up to date with routine eye exams with  Dr.Johnson   Dietary issues and exercise activities discussed: Current Exercise Habits: Home exercise routine, Type of exercise: walking, Time (Minutes): 30, Frequency (Times/Week): 5, Weekly Exercise (Minutes/Week): 150, Intensity: Mild, Exercise limited by: None identified   Goals Addressed             This Visit's Progress    Exercise 150 min/wk Moderate Activity   On track      Depression Screen    09/16/2022    9:01 AM 10/21/2021    8:42 AM 09/15/2021    9:03 AM 02/19/2020    1:56 PM  PHQ 2/9 Scores  PHQ - 2 Score 0 0 0 0    Fall Risk    09/16/2022    8:59 AM 10/21/2021    8:42 AM 09/15/2021    9:08 AM 02/19/2020     1:56 PM  Fall Risk   Falls in the past year? 0 0 0 0  Number falls in past yr: 0  0   Injury with Fall? 0  0   Risk for fall due to : No Fall Risks  No Fall Risks   Follow up Falls prevention discussed  Falls prevention discussed     FALL RISK PREVENTION PERTAINING TO THE HOME:  Any stairs in or around the home? No  If so, are there any without handrails? No  Home free of loose throw rugs in walkways, pet beds, electrical cords, etc? Yes  Adequate lighting in your home to reduce risk of falls? Yes   ASSISTIVE DEVICES UTILIZED TO PREVENT FALLS:  Life alert? No  Use of a cane, walker or w/c? No  Grab bars in the bathroom? No  Shower chair or bench in shower? No  Elevated toilet seat or a handicapped toilet? No        09/16/2022    9:03 AM 09/15/2021    9:08 AM  6CIT Screen  What Year? 0 points 0 points  What month? 0 points 0 points  What time? 0 points 0 points  Count back from 20 0 points 0 points  Months in reverse 0 points 0 points  Repeat phrase 0 points 0 points  Total Score 0 points 0 points    Immunizations Immunization History  Administered Date(s) Administered   Unspecified SARS-COV-2 Vaccination 06/30/2020, 07/28/2020    TDAP status: Due, Education has been provided regarding the importance of this vaccine. Advised may receive this vaccine at local pharmacy or Health Dept. Aware to provide a copy of the vaccination record if obtained from local pharmacy or Health Dept. Verbalized acceptance and understanding.  Flu Vaccine status: Declined, Education has been provided regarding the importance of this vaccine but patient still declined. Advised may receive this vaccine at local pharmacy or Health Dept. Aware to provide a copy of the vaccination record if obtained from local pharmacy or Health Dept. Verbalized acceptance and understanding.  Pneumococcal vaccine status: Due, Education has been provided regarding the importance of this vaccine. Advised may receive  this vaccine at local pharmacy or Health Dept. Aware to provide a copy of the vaccination record if obtained from local pharmacy or Health Dept. Verbalized acceptance and understanding.  Covid-19 vaccine status: Declined, Education has been provided regarding the importance of this vaccine but patient still declined. Advised may receive this vaccine at local pharmacy or Health Dept.or vaccine clinic. Aware to provide a copy of the vaccination record if obtained from local pharmacy or Health Dept. Verbalized acceptance and understanding.  Qualifies for Shingles Vaccine? Yes   Zostavax completed No   Shingrix Completed?: No.    Education has been provided regarding the importance of this vaccine. Patient has been advised to call insurance company to determine out of pocket expense if they have not yet received this vaccine. Advised may also receive vaccine at local pharmacy or Health Dept. Verbalized acceptance and understanding.  Screening Tests Health Maintenance  Topic Date Due   DTaP/Tdap/Td (1 - Tdap) Never done   COLONOSCOPY (Pts 45-25yrs Insurance coverage will need to be confirmed)  Never done   Zoster Vaccines- Shingrix (1 of 2) Never done   COVID-19 Vaccine (3 - 2023-24 season) 04/30/2022   Pneumonia Vaccine 61+ Years old (1 - PCV) 10/21/2022 (Originally 02/04/2017)   INFLUENZA VACCINE  11/28/2022 (Originally 03/30/2022)   DEXA SCAN  08/06/2023 (Originally 02/04/2017)   MAMMOGRAM  10/21/2022   Medicare Annual Wellness (AWV)  09/17/2023   Hepatitis C Screening  Completed   HPV VACCINES  Aged Out    Health Maintenance  Health Maintenance Due  Topic Date Due   DTaP/Tdap/Td (1 - Tdap) Never done   COLONOSCOPY (Pts 45-52yrs Insurance coverage will need to be confirmed)  Never done   Zoster Vaccines- Shingrix (1 of 2) Never done   COVID-19 Vaccine (3 - 2023-24 season) 04/30/2022    Colorectal cancer screening: Referral to GI placed declined . Pt aware the office will call re:  appt.  Mammogram status: Completed  10/21/2021. Repeat every year  Bone Density status: Ordered declined . Pt provided with contact info and advised to call to schedule appt.  Lung Cancer Screening: (Low Dose CT Chest recommended if Age 21-80 years, 30 pack-year currently smoking OR have quit w/in 15years.) does not qualify.   Lung Cancer Screening Referral: n/a  Additional Screening:  Hepatitis C Screening: does not qualify;   Vision Screening: Recommended annual ophthalmology exams for early detection of glaucoma and other disorders of the eye. Is the patient up to date with their annual eye exam?  Yes  Who is the provider or what is the name of the office in which the patient attends annual eye exams? Dr.Johnson  If pt is not established with a provider, would they like to be referred to a provider to establish care? No .   Dental Screening: Recommended annual dental exams for proper oral hygiene  Community Resource Referral / Chronic Care Management: CRR required this visit?  No   CCM required this visit?  No      Plan:     I have personally reviewed and noted the following in the patient's chart:   Medical and social history Use of alcohol, tobacco or illicit drugs  Current medications and supplements including opioid prescriptions. Patient is not currently taking opioid prescriptions. Functional ability and status Nutritional status Physical activity Advanced directives List of other physicians Hospitalizations, surgeries, and ER visits in previous 12 months Vitals Screenings to include cognitive, depression, and falls Referrals and appointments  In addition, I have reviewed and discussed with patient certain preventive protocols, quality metrics, and best practice recommendations. A written personalized care plan for preventive services as well as general preventive health recommendations were provided to patient.     Lorrene Reid, LPN   [id number]   Nurse  Notes: Declined Colonoscopy /Dexa Scan No vaccines on file

## 2022-09-16 NOTE — Patient Instructions (Signed)
Kristine Keller , Thank you for taking time to come for your Medicare Wellness Visit. I appreciate your ongoing commitment to your health goals. Please review the following plan we discussed and let me know if I can assist you in the future.   These are the goals we discussed:  Goals      Exercise 150 min/wk Moderate Activity        This is a list of the screening recommended for you and due dates:  Health Maintenance  Topic Date Due   DTaP/Tdap/Td vaccine (1 - Tdap) Never done   Colon Cancer Screening  Never done   Zoster (Shingles) Vaccine (1 of 2) Never done   COVID-19 Vaccine (3 - 2023-24 season) 04/30/2022   Pneumonia Vaccine (1 - PCV) 10/21/2022*   Flu Shot  11/28/2022*   DEXA scan (bone density measurement)  08/06/2023*   Mammogram  10/21/2022   Medicare Annual Wellness Visit  09/17/2023   Hepatitis C Screening: USPSTF Recommendation to screen - Ages 18-79 yo.  Completed   HPV Vaccine  Aged Out  *Topic was postponed. The date shown is not the original due date.    Advanced directives: Please bring a copy of your health care power of attorney and living will to the office to be added to your chart at your convenience.   Conditions/risks identified: Aim for 30 minutes of exercise or brisk walking, 6-8 glasses of water, and 5 servings of fruits and vegetables each day.   Next appointment: Follow up in one year for your annual wellness visit    Preventive Care 65 Years and Older, Female Preventive care refers to lifestyle choices and visits with your health care provider that can promote health and wellness. What does preventive care include? A yearly physical exam. This is also called an annual well check. Dental exams once or twice a year. Routine eye exams. Ask your health care provider how often you should have your eyes checked. Personal lifestyle choices, including: Daily care of your teeth and gums. Regular physical activity. Eating a healthy diet. Avoiding tobacco  and drug use. Limiting alcohol use. Practicing safe sex. Taking low-dose aspirin every day. Taking vitamin and mineral supplements as recommended by your health care provider. What happens during an annual well check? The services and screenings done by your health care provider during your annual well check will depend on your age, overall health, lifestyle risk factors, and family history of disease. Counseling  Your health care provider may ask you questions about your: Alcohol use. Tobacco use. Drug use. Emotional well-being. Home and relationship well-being. Sexual activity. Eating habits. History of falls. Memory and ability to understand (cognition). Work and work Statistician. Reproductive health. Screening  You may have the following tests or measurements: Height, weight, and BMI. Blood pressure. Lipid and cholesterol levels. These may be checked every 5 years, or more frequently if you are over 38 years old. Skin check. Lung cancer screening. You may have this screening every year starting at age 55 if you have a 30-pack-year history of smoking and currently smoke or have quit within the past 15 years. Fecal occult blood test (FOBT) of the stool. You may have this test every year starting at age 19. Flexible sigmoidoscopy or colonoscopy. You may have a sigmoidoscopy every 5 years or a colonoscopy every 10 years starting at age 9. Hepatitis C blood test. Hepatitis B blood test. Sexually transmitted disease (STD) testing. Diabetes screening. This is done by checking your blood sugar (  glucose) after you have not eaten for a while (fasting). You may have this done every 1-3 years. Bone density scan. This is done to screen for osteoporosis. You may have this done starting at age 80. Mammogram. This may be done every 1-2 years. Talk to your health care provider about how often you should have regular mammograms. Talk with your health care provider about your test results,  treatment options, and if necessary, the need for more tests. Vaccines  Your health care provider may recommend certain vaccines, such as: Influenza vaccine. This is recommended every year. Tetanus, diphtheria, and acellular pertussis (Tdap, Td) vaccine. You may need a Td booster every 10 years. Zoster vaccine. You may need this after age 35. Pneumococcal 13-valent conjugate (PCV13) vaccine. One dose is recommended after age 22. Pneumococcal polysaccharide (PPSV23) vaccine. One dose is recommended after age 86. Talk to your health care provider about which screenings and vaccines you need and how often you need them. This information is not intended to replace advice given to you by your health care provider. Make sure you discuss any questions you have with your health care provider. Document Released: 09/12/2015 Document Revised: 05/05/2016 Document Reviewed: 06/17/2015 Elsevier Interactive Patient Education  2017 [place] Prevention in the Home Falls can cause injuries. They can happen to people of all ages. There are many things you can do to make your home safe and to help prevent falls. What can I do on the outside of my home? Regularly fix the edges of walkways and driveways and fix any cracks. Remove anything that might make you trip as you walk through a door, such as a raised step or threshold. Trim any bushes or trees on the path to your home. Use bright outdoor lighting. Clear any walking paths of anything that might make someone trip, such as rocks or tools. Regularly check to see if handrails are loose or broken. Make sure that both sides of any steps have handrails. Any raised decks and porches should have guardrails on the edges. Have any leaves, snow, or ice cleared regularly. Use sand or salt on walking paths during winter. Clean up any spills in your garage right away. This includes oil or grease spills. What can I do in the bathroom? Use night  lights. Install grab bars by the toilet and in the tub and shower. Do not use towel bars as grab bars. Use non-skid mats or decals in the tub or shower. If you need to sit down in the shower, use a plastic, non-slip stool. Keep the floor dry. Clean up any water that spills on the floor as soon as it happens. Remove soap buildup in the tub or shower regularly. Attach bath mats securely with double-sided non-slip rug tape. Do not have throw rugs and other things on the floor that can make you trip. What can I do in the bedroom? Use night lights. Make sure that you have a light by your bed that is easy to reach. Do not use any sheets or blankets that are too big for your bed. They should not hang down onto the floor. Have a firm chair that has side arms. You can use this for support while you get dressed. Do not have throw rugs and other things on the floor that can make you trip. What can I do in the kitchen? Clean up any spills right away. Avoid walking on wet floors. Keep items that you use a lot in easy-to-reach places.  If you need to reach something above you, use a strong step stool that has a grab bar. Keep electrical cords out of the way. Do not use floor polish or wax that makes floors slippery. If you must use wax, use non-skid floor wax. Do not have throw rugs and other things on the floor that can make you trip. What can I do with my stairs? Do not leave any items on the stairs. Make sure that there are handrails on both sides of the stairs and use them. Fix handrails that are broken or loose. Make sure that handrails are as long as the stairways. Check any carpeting to make sure that it is firmly attached to the stairs. Fix any carpet that is loose or worn. Avoid having throw rugs at the top or bottom of the stairs. If you do have throw rugs, attach them to the floor with carpet tape. Make sure that you have a light switch at the top of the stairs and the bottom of the stairs. If  you do not have them, ask someone to add them for you. What else can I do to help prevent falls? Wear shoes that: Do not have high heels. Have rubber bottoms. Are comfortable and fit you well. Are closed at the toe. Do not wear sandals. If you use a stepladder: Make sure that it is fully opened. Do not climb a closed stepladder. Make sure that both sides of the stepladder are locked into place. Ask someone to hold it for you, if possible. Clearly [place] and make sure that you can see: Any grab bars or handrails. First and last steps. Where the edge of each step is. Use tools that help you move around (mobility aids) if they are needed. These include: Canes. Walkers. Scooters. Crutches. Turn on the lights when you go into a dark area. Replace any light bulbs as soon as they burn out. Set up your furniture so you have a clear path. Avoid moving your furniture around. If any of your floors are uneven, fix them. If there are any pets around you, be aware of where they are. Review your medicines with your doctor. Some medicines can make you feel dizzy. This can increase your chance of falling. Ask your doctor what other things that you can do to help prevent falls. This information is not intended to replace advice given to you by your health care provider. Make sure you discuss any questions you have with your health care provider. Document Released: 06/12/2009 Document Revised: 01/22/2016 Document Reviewed: 09/20/2014 Elsevier Interactive Patient Education  2017 [employer].

## 2022-11-11 DIAGNOSIS — L820 Inflamed seborrheic keratosis: Secondary | ICD-10-CM | POA: Diagnosis not present

## 2023-06-29 ENCOUNTER — Encounter: Payer: Self-pay | Admitting: Family Medicine

## 2023-09-05 ENCOUNTER — Other Ambulatory Visit: Payer: Self-pay | Admitting: Family Medicine

## 2023-09-05 DIAGNOSIS — Z0000 Encounter for general adult medical examination without abnormal findings: Secondary | ICD-10-CM

## 2023-09-20 ENCOUNTER — Ambulatory Visit: Payer: Medicare Other

## 2023-09-20 VITALS — Ht 63.0 in | Wt 133.0 lb

## 2023-09-20 DIAGNOSIS — Z0000 Encounter for general adult medical examination without abnormal findings: Secondary | ICD-10-CM | POA: Diagnosis not present

## 2023-09-20 NOTE — Progress Notes (Signed)
Subjective:   Kristine Keller is a 73 y.o. female who presents for Medicare Annual (Subsequent) preventive examination.  Visit Complete: Virtual I connected with  Kristine Keller on 09/20/23 by a audio enabled telemedicine application and verified that I am speaking with the correct person using two identifiers.  Patient Location: Home  Provider Location: Home Office  This patient declined Interactive audio and video telecommunications. Therefore the visit was completed with audio only.  I discussed the limitations of evaluation and management by telemedicine. The patient expressed understanding and agreed to proceed.  Vital Signs: Because this visit was a virtual/telehealth visit, some criteria may be missing or patient reported. Any vitals not documented were not able to be obtained and vitals that have been documented are patient reported.  Cardiac Risk Factors include: advanced age (>74men, >8 women)     Objective:    Today's Vitals   09/20/23 0815  Weight: 133 lb (60.3 kg)  Height: 5\' 3"  (1.6 m)   Body mass index is 23.56 kg/m.     09/20/2023    9:45 AM 09/16/2022    9:03 AM 09/15/2021    9:08 AM  Advanced Directives  Does Patient Have a Medical Advance Directive? No Yes Yes  Type of Special educational needs teacher of [place];Living will Healthcare Power of [place];Living will  Copy of Healthcare Power of Attorney in Chart?  No - copy requested No - copy requested  Would patient like information on creating a medical advance directive? Yes (MAU/Ambulatory/Procedural Areas - Information given)      Current Medications (verified) Outpatient Encounter Medications as of 09/20/2023  Medication Sig   Multiple Vitamin (MULTIVITAMIN) tablet Take 1 tablet by mouth daily.   estradiol (ESTRACE) 0.1 MG/GM vaginal cream FINGER APPLICATION TO THE VAGINA AT BEDTIME AS DIRECTED (Patient not taking: Reported on 09/20/2023)   No facility-administered encounter medications on file  as of 09/20/2023.    Allergies (verified) Codeine and Sulfa antibiotics   History: History reviewed. No pertinent past medical history. Past Surgical History:  Procedure Laterality Date   ABDOMINAL HYSTERECTOMY     Family History  Problem Relation Age of Onset   Cancer Mother        lung, kidney, cervical   Hypertension Mother    COPD Father    COPD Brother    Hypertension Brother    Cancer Brother        kidney   Autoimmune disease Daughter    Social History   Socioeconomic History   Marital status: Married    Spouse name: Richard   Number of children: 3   Years of education: 10   Highest education level: 10th grade  Occupational History   Occupation: retired  Tobacco Use   Smoking status: Never   Smokeless tobacco: Never  Vaping Use   Vaping status: Never Used  Substance and Sexual Activity   Alcohol use: Never   Drug use: Never   Sexual activity: Not Currently  Other Topics Concern   Not on file  Social History Narrative   Daughter, Kristine Keller   Her grandson and his 2 disabled children live with her (one with epilepsy, other with autism)-born 2016 & 2017   Social Drivers of Health   Financial Resource Strain: Low Risk  (09/20/2023)   Overall Financial Resource Strain (CARDIA)    Difficulty of Paying Living Expenses: Not hard at all  Food Insecurity: No Food Insecurity (09/20/2023)   Hunger Vital Sign    Worried About Running Out  of Food in the Last Year: Never true    Ran Out of Food in the Last Year: Never true  Transportation Needs: No Transportation Needs (09/20/2023)   PRAPARE - Administrator, Civil Service (Medical): No    Lack of Transportation (Non-Medical): No  Physical Activity: Sufficiently Active (09/20/2023)   Exercise Vital Sign    Days of Exercise per Week: 5 days    Minutes of Exercise per Session: 30 min  Stress: No Stress Concern Present (09/20/2023)   [employer] of Occupational Health - Occupational Stress Questionnaire     Feeling of Stress : Not at all  Social Connections: Moderately Isolated (09/20/2023)   Social Connection and Isolation Panel [NHANES]    Frequency of Communication with Friends and Family: More than three times a week    Frequency of Social Gatherings with Friends and Family: Three times a week    Attends Religious Services: Never    Active Member of Clubs or Organizations: No    Attends Engineer, structural: Never    Marital Status: Married    Tobacco Counseling Counseling given: Not Answered   Clinical Intake:  Pre-visit preparation completed: Yes  Pain : No/denies pain     Diabetes: No  How often do you need to have someone help you when you read instructions, pamphlets, or other written materials from your doctor or pharmacy?: 1 - Never  Interpreter Needed?: No  Information entered by :: Kandis Fantasia LPN   Activities of Daily Living    09/20/2023    8:20 AM  In your present state of health, do you have any difficulty performing the following activities:  Hearing? 0  Vision? 0  Difficulty concentrating or making decisions? 0  Walking or climbing stairs? 0  Dressing or bathing? 0  Doing errands, shopping? 0  Preparing Food and eating ? N  Using the Toilet? N  In the past six months, have you accidently leaked urine? N  Do you have problems with loss of bowel control? N  Managing your Medications? N  Managing your Finances? N  Housekeeping or managing your Housekeeping? N    Patient Care Team: Raliegh Ip, DO as PCP - General (Family Medicine)  Indicate any recent Medical Services you may have received from other than [facility] providers in the past year (date may be approximate).     Assessment:   This is a routine wellness examination for [place].  Hearing/Vision screen Hearing Screening - Comments:: Denies hearing difficulties   Vision Screening - Comments:: Wears rx glasses - up to date with routine eye exams with Dr. Conley Rolls      Goals Addressed             This Visit's Progress    Remain active and independent        Depression Screen    09/20/2023    9:44 AM 09/16/2022    9:01 AM 10/21/2021    8:42 AM 09/15/2021    9:03 AM 02/19/2020    1:56 PM  PHQ 2/9 Scores  PHQ - 2 Score 0 0 0 0 0    Fall Risk    09/20/2023    8:19 AM 09/16/2022    8:59 AM 10/21/2021    8:42 AM 09/15/2021    9:08 AM 02/19/2020    1:56 PM  Fall Risk   Falls in the past year? 0 0 0 0 0  Number falls in past yr: 0 0  0  Injury with Fall? 0 0  0   Risk for fall due to : No Fall Risks No Fall Risks  No Fall Risks   Follow up Falls prevention discussed;Education provided;Falls evaluation completed Falls prevention discussed  Falls prevention discussed     MEDICARE RISK AT HOME: Medicare Risk at Home Any stairs in or around the home?: No If so, are there any without handrails?: No Home free of loose throw rugs in walkways, pet beds, electrical cords, etc?: Yes Adequate lighting in your home to reduce risk of falls?: Yes Life alert?: No Use of a cane, walker or w/c?: No Grab bars in the bathroom?: Yes Shower chair or bench in shower?: No Elevated toilet seat or a handicapped toilet?: Yes  TIMED UP AND GO:  Was the test performed?  No    Cognitive Function:        09/20/2023    8:20 AM 09/16/2022    9:03 AM 09/15/2021    9:08 AM  6CIT Screen  What Year? 0 points 0 points 0 points  What month? 0 points 0 points 0 points  What time? 0 points 0 points 0 points  Count back from 20 0 points 0 points 0 points  Months in reverse 0 points 0 points 0 points  Repeat phrase 0 points 0 points 0 points  Total Score 0 points 0 points 0 points    Immunizations Immunization History  Administered Date(s) Administered   Unspecified SARS-COV-2 Vaccination 06/30/2020, 07/28/2020    TDAP status: Due, Education has been provided regarding the importance of this vaccine. Advised may receive this vaccine at local pharmacy or Health Dept.  Aware to provide a copy of the vaccination record if obtained from local pharmacy or Health Dept. Verbalized acceptance and understanding.  Flu Vaccine status: Declined, Education has been provided regarding the importance of this vaccine but patient still declined. Advised may receive this vaccine at local pharmacy or Health Dept. Aware to provide a copy of the vaccination record if obtained from local pharmacy or Health Dept. Verbalized acceptance and understanding.  Pneumococcal vaccine status: Declined,  Education has been provided regarding the importance of this vaccine but patient still declined. Advised may receive this vaccine at local pharmacy or Health Dept. Aware to provide a copy of the vaccination record if obtained from local pharmacy or Health Dept. Verbalized acceptance and understanding.   Covid-19 vaccine status: Declined, Education has been provided regarding the importance of this vaccine but patient still declined. Advised may receive this vaccine at local pharmacy or Health Dept.or vaccine clinic. Aware to provide a copy of the vaccination record if obtained from local pharmacy or Health Dept. Verbalized acceptance and understanding.  Qualifies for Shingles Vaccine? Yes   Zostavax completed No   Shingrix Completed?: No.    Education has been provided regarding the importance of this vaccine. Patient has been advised to call insurance company to determine out of pocket expense if they have not yet received this vaccine. Advised may also receive vaccine at local pharmacy or Health Dept. Verbalized acceptance and understanding.  Screening Tests Health Maintenance  Topic Date Due   DTaP/Tdap/Td (1 - Tdap) Never done   Colonoscopy  Never done   Zoster Vaccines- Shingrix (1 of 2) Never done   Pneumonia Vaccine 93+ Years old (1 of 1 - PCV) Never done   DEXA SCAN  Never done   MAMMOGRAM  10/21/2022   COVID-19 Vaccine (3 - 2024-25 season) 05/01/2023   INFLUENZA VACCINE  11/28/2023  (Originally 03/31/2023)   Medicare Annual Wellness (AWV)  09/19/2024   Hepatitis C Screening  Completed   HPV VACCINES  Aged Out    Health Maintenance  Health Maintenance Due  Topic Date Due   DTaP/Tdap/Td (1 - Tdap) Never done   Colonoscopy  Never done   Zoster Vaccines- Shingrix (1 of 2) Never done   Pneumonia Vaccine 22+ Years old (1 of 1 - PCV) Never done   DEXA SCAN  Never done   MAMMOGRAM  10/21/2022   COVID-19 Vaccine (3 - 2024-25 season) 05/01/2023    Colorectal cancer screening:  Patient declines at this time   Mammogram status: Ordered and scheduled for 10/26/23. Pt provided with contact info and advised to call to schedule appt.   Bone Density status:  Patient declines at this time   Lung Cancer Screening: (Low Dose CT Chest recommended if Age 14-80 years, 20 pack-year currently smoking OR have quit w/in 15years.) does not qualify.   Lung Cancer Screening Referral: n/a  Additional Screening:  Hepatitis C Screening: does qualify; Completed 02/21/20  Vision Screening: Recommended annual ophthalmology exams for early detection of glaucoma and other disorders of the eye. Is the patient up to date with their annual eye exam?  Yes  Who is the provider or what is the name of the office in which the patient attends annual eye exams? Dr. Conley Rolls  If pt is not established with a provider, would they like to be referred to a provider to establish care? No .   Dental Screening: Recommended annual dental exams for proper oral hygiene  Community Resource Referral / Chronic Care Management: CRR required this visit?  No   CCM required this visit?  No     Plan:     I have personally reviewed and noted the following in the patient's chart:   Medical and social history Use of alcohol, tobacco or illicit drugs  Current medications and supplements including opioid prescriptions. Patient is not currently taking opioid prescriptions. Functional ability and status Nutritional  status Physical activity Advanced directives List of other physicians Hospitalizations, surgeries, and ER visits in previous 12 months Vitals Screenings to include cognitive, depression, and falls Referrals and appointments  In addition, I have reviewed and discussed with patient certain preventive protocols, quality metrics, and best practice recommendations. A written personalized care plan for preventive services as well as general preventive health recommendations were provided to patient.     Kandis Fantasia [place], [place]   9/60/4540   After Visit Summary: (Mail) Due to this being a telephonic visit, the after visit summary with patients personalized plan was offered to patient via mail   Nurse Notes: No concerns at this time

## 2023-09-20 NOTE — Patient Instructions (Signed)
Kristine Keller , Thank you for taking time to come for your Medicare Wellness Visit. I appreciate your ongoing commitment to your health goals. Please review the following plan we discussed and let me know if I can assist you in the future.   Referrals/Orders/Follow-Ups/Clinician Recommendations: Aim for 30 minutes of exercise or brisk walking, 6-8 glasses of water, and 5 servings of fruits and vegetables each day.  This is a list of the screening recommended for you and due dates:  Health Maintenance  Topic Date Due   DTaP/Tdap/Td vaccine (1 - Tdap) Never done   Colon Cancer Screening  Never done   Zoster (Shingles) Vaccine (1 of 2) Never done   Pneumonia Vaccine (1 of 1 - PCV) Never done   DEXA scan (bone density measurement)  Never done   Mammogram  10/21/2022   COVID-19 Vaccine (3 - 2024-25 season) 05/01/2023   Flu Shot  11/28/2023*   Medicare Annual Wellness Visit  09/19/2024   Hepatitis C Screening  Completed   HPV Vaccine  Aged Out  *Topic was postponed. The date shown is not the original due date.    Advanced directives: (ACP Link)Information on Advanced Care Planning can be found at [facility] Advance Health Care Directives Advance Health Care Directives ([email or web address]   Next Medicare Annual Wellness Visit scheduled for next year: Yes

## 2023-10-26 ENCOUNTER — Ambulatory Visit
Admission: RE | Admit: 2023-10-26 | Discharge: 2023-10-26 | Disposition: A | Discharge status: Home / Self Care | Payer: Medicare Other | Source: Ambulatory Visit | Attending: Family Medicine | Admitting: Family Medicine

## 2023-10-26 DIAGNOSIS — Z1231 Encounter for screening mammogram for malignant neoplasm of breast: Secondary | ICD-10-CM | POA: Diagnosis not present

## 2023-10-26 DIAGNOSIS — Z0000 Encounter for general adult medical examination without abnormal findings: Secondary | ICD-10-CM

## 2023-10-31 ENCOUNTER — Ambulatory Visit: Payer: Medicare Other | Admitting: Family Medicine

## 2023-10-31 NOTE — Progress Notes (Deleted)
 Subjective: CC:*** PCP: Raliegh Ip, DO [id number] Michelle is a 73 y.o. female presenting to clinic today for:  1. ***   ROS: Per HPI  Allergies  Allergen Reactions   Codeine Other (See Comments)   Sulfa Antibiotics Anaphylaxis   No past medical history on file.  Current Outpatient Medications:    estradiol (ESTRACE) 0.1 MG/GM vaginal cream, FINGER APPLICATION TO THE VAGINA AT BEDTIME AS DIRECTED (Patient not taking: Reported on 09/20/2023), Disp: 42.5 g, Rfl: PRN   Multiple Vitamin (MULTIVITAMIN) tablet, Take 1 tablet by mouth daily., Disp: , Rfl:  Social History   Socioeconomic History   Marital status: Married    Spouse name: Richard   Number of children: 3   Years of education: 10   Highest education level: 10th grade  Occupational History   Occupation: retired  Tobacco Use   Smoking status: Never   Smokeless tobacco: Never  Vaping Use   Vaping status: Never Used  Substance and Sexual Activity   Alcohol use: Never   Drug use: Never   Sexual activity: Not Currently  Other Topics Concern   Not on file  Social History Narrative   Daughter, [name]   Her grandson and his 2 disabled children live with her (one with epilepsy, other with autism)-born 2016 & 2017   Social Drivers of Health   Financial Resource Strain: Low Risk  (09/20/2023)   Overall Financial Resource Strain (CARDIA)    Difficulty of Paying Living Expenses: Not hard at all  Food Insecurity: No Food Insecurity (09/20/2023)   Hunger Vital Sign    Worried About Running Out of Food in the Last Year: Never true    Ran Out of Food in the Last Year: Never true  Transportation Needs: No Transportation Needs (09/20/2023)   PRAPARE - Administrator, Civil Service (Medical): No    Lack of Transportation (Non-Medical): No  Physical Activity: Sufficiently Active (09/20/2023)   Exercise Vital Sign    Days of Exercise per Week: 5 days    Minutes of Exercise per Session: 30 min  Stress: No  Stress Concern Present (09/20/2023)   [employer] of Occupational Health - Occupational Stress Questionnaire    Feeling of Stress : Not at all  Social Connections: Moderately Isolated (09/20/2023)   Social Connection and Isolation Panel [NHANES]    Frequency of Communication with Friends and Family: More than three times a week    Frequency of Social Gatherings with Friends and Family: Three times a week    Attends Religious Services: Never    Active Member of Clubs or Organizations: No    Attends Banker Meetings: Never    Marital Status: Married  Catering manager Violence: Not At Risk (09/20/2023)   Humiliation, Afraid, Rape, and Kick questionnaire    Fear of Current or Ex-Partner: No    Emotionally Abused: No    Physically Abused: No    Sexually Abused: No   Family History  Problem Relation Age of Onset   Cancer Mother        lung, kidney, cervical   Hypertension Mother    COPD Father    Autoimmune disease Daughter    COPD Brother    Hypertension Brother    Cancer Brother        kidney   Breast cancer Neg Hx     Objective: Office vital signs reviewed. There were no vitals taken for this visit.  Physical Examination:  General: Awake, alert, ***  nourished, No acute distress HEENT: Normal    Neck: No masses palpated. No lymphadenopathy    Ears: Tympanic membranes intact, normal light reflex, no erythema, no bulging    Eyes: PERRLA, extraocular membranes intact, sclera ***    Nose: nasal turbinates moist, *** nasal discharge    Throat: moist mucus membranes, no erythema, *** tonsillar exudate.  Airway is patent Cardio: regular rate and rhythm, S1S2 heard, no murmurs appreciated Pulm: clear to auscultation bilaterally, no wheezes, rhonchi or rales; normal work of breathing on room air GI: soft, non-tender, non-distended, bowel sounds present x4, no hepatomegaly, no splenomegaly, no masses GU: external vaginal tissue ***, cervix ***, *** punctate lesions on  cervix appreciated, *** discharge from cervical os, *** bleeding, *** cervical motion tenderness, *** abdominal/ adnexal masses Extremities: warm, well perfused, No edema, cyanosis or clubbing; +*** pulses bilaterally MSK: *** gait and *** station Skin: dry; intact; no rashes or lesions Neuro: *** Strength and light touch sensation grossly intact, *** DTRs ***/4  Assessment/ Plan: 73 y.o. female   No diagnosis found.  ***   Khristi Schiller Hulen Skains, DO [facility] [phone]

## 2023-11-07 ENCOUNTER — Ambulatory Visit (INDEPENDENT_AMBULATORY_CARE_PROVIDER_SITE_OTHER): Admitting: Family Medicine

## 2023-11-07 ENCOUNTER — Encounter: Payer: Self-pay | Admitting: Family Medicine

## 2023-11-07 VITALS — BP 148/81 | HR 73 | Temp 98.4°F | Ht 63.0 in | Wt 145.6 lb

## 2023-11-07 DIAGNOSIS — Z5320 Procedure and treatment not carried out because of patient's decision for unspecified reasons: Secondary | ICD-10-CM | POA: Diagnosis not present

## 2023-11-07 DIAGNOSIS — E7800 Pure hypercholesterolemia, unspecified: Secondary | ICD-10-CM | POA: Diagnosis not present

## 2023-11-07 DIAGNOSIS — Z2821 Immunization not carried out because of patient refusal: Secondary | ICD-10-CM | POA: Diagnosis not present

## 2023-11-07 LAB — CBC
Hematocrit: 43.1 % (ref 34.0–46.6)
Hemoglobin: 14.1 g/dL (ref 11.1–15.9)
MCH: 30.4 pg (ref 26.6–33.0)
MCHC: 32.7 g/dL (ref 31.5–35.7)
MCV: 93 fL (ref 79–97)
Platelets: 168 10*3/uL (ref 150–450)
RBC: 4.64 x10E6/uL (ref 3.77–5.28)
RDW: 12.4 % (ref 11.7–15.4)
WBC: 4 10*3/uL (ref 3.4–10.8)

## 2023-11-07 LAB — CMP14+EGFR
ALT: 20 IU/L (ref 0–32)
AST: 27 IU/L (ref 0–40)
Albumin: 4.4 g/dL (ref 3.8–4.8)
Alkaline Phosphatase: 52 IU/L (ref 44–121)
BUN/Creatinine Ratio: 14 (ref 12–28)
BUN: 11 mg/dL (ref 8–27)
Bilirubin Total: 0.4 mg/dL (ref 0.0–1.2)
CO2: 25 mmol/L (ref 20–29)
Calcium: 9.3 mg/dL (ref 8.7–10.3)
Chloride: 104 mmol/L (ref 96–106)
Creatinine, Ser: 0.8 mg/dL (ref 0.57–1.00)
Globulin, Total: 2.5 g/dL (ref 1.5–4.5)
Glucose: 85 mg/dL (ref 70–99)
Potassium: 4.2 mmol/L (ref 3.5–5.2)
Sodium: 140 mmol/L (ref 134–144)
Total Protein: 6.9 g/dL (ref 6.0–8.5)
eGFR: 79 mL/min/{1.73_m2} (ref >59)

## 2023-11-07 LAB — LIPID PANEL
Chol/HDL Ratio: 4.5 ratio — ABNORMAL HIGH (ref 0.0–4.4)
Cholesterol, Total: 228 mg/dL — ABNORMAL HIGH (ref 100–199)
HDL: 51 mg/dL (ref >39)
LDL Chol Calc (NIH): 156 mg/dL — ABNORMAL HIGH (ref 0–99)
Triglycerides: 115 mg/dL (ref 0–149)
VLDL Cholesterol Cal: 21 mg/dL (ref 5–40)

## 2023-11-07 LAB — TSH: TSH: 1.42 u[IU]/mL (ref 0.450–4.500)

## 2023-11-07 NOTE — Patient Instructions (Signed)
Colorectal Cancer Screening  Colorectal cancer screening is a group of tests that are used to check for colorectal cancer before symptoms develop. Colorectal refers to the colon and rectum. The colon and rectum are located at the end of the digestive tract and carry stool (feces) out of the body. Who should have screening? All adults who are 45-72 years old should have screening. Your health care provider may recommend screening before age 45. You will have tests every 1-10 years, depending on your results and the type of screening test. Screening recommendations for adults who are 76-85 years old vary depending on a person's health. People older than age 85 should no longer get colorectal cancer screening. You may have screening tests starting before age 45, or more often than other people, if you have any of these risk factors: A personal or family history of colorectal cancer or abnormal growths known as polyps in your colon. Inflammatory bowel disease, such as ulcerative colitis or Crohn's disease. A history of having radiation treatment to the abdomen or the area between the hip bones (pelvic area) for cancer. A type of genetic syndrome that is passed from parent to child (hereditary), such as: Lynch syndrome. Familial adenomatous polyposis. Turcot syndrome. Peutz-Jeghers syndrome. MUTYH-associated polyposis (MAP). A personal history of diabetes. Types of tests There are several types of colorectal screening tests. You may have one or more of the following: Guaiac-based fecal occult blood testing. For this test, a stool sample is checked for hidden (occult) blood, which could be a sign of colorectal cancer. Fecal immunochemical test (FIT). For this test, a stool sample is checked for blood, which could be a sign of colorectal cancer. Stool DNA test. For this test, a stool sample is checked for blood and changes in DNA that could lead to colorectal cancer. Sigmoidoscopy. During this test, a  thin, flexible tube with a camera on the end, called a sigmoidoscope, is used to examine the rectum and the lower colon. Colonoscopy. During this test, a long, flexible tube with a camera on the end, called a colonoscope, is used to examine the entire colon and rectum. Also, sometimes a tissue sample is taken to be looked at under a microscope (biopsy) or small polyps are removed during this test. Virtual colonoscopy. Instead of a colonoscope, this type of colonoscopy uses a CT scan to take pictures of the colon and rectum. A CT scan is a type of X-ray that is made using computers. What are the benefits of screening? Screening reduces your risk for colorectal cancer and can help identify cancer at an early stage, when the cancer can be removed or treated more easily. It is common for polyps to form in the lining of the colon, especially as you age. These polyps may be cancerous or become cancerous over time. Screening can identify these polyps. What are the risks of screening? Generally, these are safe tests. However, problems may occur, including: The need for more tests to confirm results from a stool sample test. Stool sample tests have fewer risks than other types of screening tests. Being exposed to low levels of radiation, if you had a test involving X-rays. This may slightly increase your cancer risk. The benefit of detecting cancer outweighs the slight increase in risk. Bleeding, damage to the intestine, or infection caused by a sigmoidoscopy or colonoscopy. A reaction to medicines given during a sigmoidoscopy or colonoscopy. Talk with your health care provider to understand your risk for colorectal cancer and to make a   screening plan that is right for you. Questions to ask your health care provider When should I start colorectal cancer screening? What is my risk for colorectal cancer? How often do I need screening? Which screening tests do I need? How do I get my test results? What do my  results mean? Where to find more information Learn more about colorectal cancer screening from: The American Cancer Society: cancer.org National Cancer Institute: cancer.gov Summary Colorectal cancer screening is a group of tests used to check for colorectal cancer before symptoms develop. All adults who are 45-72 years old should have screening. Your health care provider may recommend screening before age 45. You may have screening tests starting before age 45, or more often than other people, if you have certain risk factors. Screening reduces your risk for colorectal cancer and can help identify cancer at an early stage, when the cancer can be removed or treated more easily. Talk with your health care provider to understand your risk for colorectal cancer and to make a screening plan that is right for you. This information is not intended to replace advice given to you by your health care provider. Make sure you discuss any questions you have with your health care provider. Document Revised: 12/05/2019 Document Reviewed: 12/05/2019 Elsevier Patient Education  2024 Elsevier Inc.  

## 2023-11-07 NOTE — Progress Notes (Signed)
 Kristine Keller is a 73 y.o. female presents to office today for annual physical exam examination.    Concerns today include: 1.  None.  She is doing well.  Occupation: Retired, Marital status: Married, Substance use: None There are no preventive care reminders to display for this patient.  Refills needed today: N/A.  Using Estrace only once per week.  Immunization History  Administered Date(s) Administered   Unspecified SARS-COV-2 Vaccination 06/30/2020, 07/28/2020   History reviewed. No pertinent past medical history. Social History   Socioeconomic History   Marital status: Married    Spouse name: Richard   Number of children: 3   Years of education: 10   Highest education level: 10th grade  Occupational History   Occupation: retired  Tobacco Use   Smoking status: Never   Smokeless tobacco: Never  Vaping Use   Vaping status: Never Used  Substance and Sexual Activity   Alcohol use: Never   Drug use: Never   Sexual activity: Not Currently  Other Topics Concern   Not on file  Social History Narrative   Daughter, Kristine Keller   Her grandson and his 2 disabled children live with her (one with epilepsy, other with autism)-born 2016 & 2017   Likes minimally invasive medical approach.   Social Drivers of Corporate investment banker Strain: Low Risk  (09/20/2023)   Overall Financial Resource Strain (CARDIA)    Difficulty of Paying Living Expenses: Not hard at all  Food Insecurity: No Food Insecurity (09/20/2023)   Hunger Vital Sign    Worried About Running Out of Food in the Last Year: Never true    Ran Out of Food in the Last Year: Never true  Transportation Needs: No Transportation Needs (09/20/2023)   PRAPARE - Administrator, Civil Service (Medical): No    Lack of Transportation (Non-Medical): No  Physical Activity: Sufficiently Active (09/20/2023)   Exercise Vital Sign    Days of Exercise per Week: 5 days    Minutes of Exercise per Session: 30 min  Stress:  No Stress Concern Present (09/20/2023)   [employer] of Occupational Health - Occupational Stress Questionnaire    Feeling of Stress : Not at all  Social Connections: Moderately Isolated (09/20/2023)   Social Connection and Isolation Panel [NHANES]    Frequency of Communication with Friends and Family: More than three times a week    Frequency of Social Gatherings with Friends and Family: Three times a week    Attends Religious Services: Never    Active Member of Clubs or Organizations: No    Attends Banker Meetings: Never    Marital Status: Married  Catering manager Violence: Not At Risk (09/20/2023)   Humiliation, Afraid, Rape, and Kick questionnaire    Fear of Current or Ex-Partner: No    Emotionally Abused: No    Physically Abused: No    Sexually Abused: No   Past Surgical History:  Procedure Laterality Date   ABDOMINAL HYSTERECTOMY     Family History  Problem Relation Age of Onset   Cancer Mother        lung, kidney, cervical   Hypertension Mother    COPD Father    Autoimmune disease Daughter    COPD Brother    Hypertension Brother    Cancer Brother        kidney   Breast cancer Neg Hx     Current Outpatient Medications:    estradiol (ESTRACE) 0.1 MG/GM vaginal cream, Place 1  Applicatorful vaginally once a week., Disp: , Rfl:    Omega-3 Fatty Acids (FISH OIL) 1200 MG CAPS, Take 1 capsule by mouth daily., Disp: , Rfl:    Multiple Vitamin (MULTIVITAMIN) tablet, Take 1 tablet by mouth daily., Disp: , Rfl:   Allergies  Allergen Reactions   Codeine Other (See Comments)   Sulfa Antibiotics Anaphylaxis     ROS: Review of Systems Pertinent items noted in HPI and remainder of comprehensive ROS otherwise negative.    Physical exam BP (!) 148/81   Pulse 73   Temp 98.4 F (36.9 C)   Ht 5\' 3"  (1.6 m)   Wt 145 lb 9.6 oz (66 kg)   SpO2 97%   BMI 25.79 kg/m  General appearance: alert, cooperative, and appears stated age Head: Normocephalic, without  obvious abnormality, atraumatic Eyes: negative findings: lids and lashes normal, conjunctivae and sclerae normal, corneas clear, and pupils equal, round, reactive to light and accomodation Ears: normal TM's and external ear canals both ears Nose: Nares normal. Septum midline. Mucosa normal. No drainage or sinus tenderness. Throat:  Oropharynx without masses or lesions.  She has false upper and lower teeth Neck: no adenopathy, no carotid bruit, supple, symmetrical, trachea midline, and thyroid not enlarged, symmetric, no tenderness/mass/nodules Back: symmetric, no curvature. ROM normal. No CVA tenderness. Lungs: clear to auscultation bilaterally Heart: regular rate and rhythm, S1, S2 normal, no murmur, click, rub or gallop Abdomen: soft, non-tender; bowel sounds normal; no masses,  no organomegaly Extremities: extremities normal, atraumatic, no cyanosis or edema Pulses: 2+ and symmetric Skin: Skin color, texture, turgor normal. No rashes or lesions Lymph nodes: Cervical, supraclavicular, and axillary nodes normal. Neurologic: Grossly normal      11/07/2023    9:56 AM 09/20/2023    9:44 AM 09/16/2022    9:01 AM  Depression screen PHQ 2/9  Decreased Interest 0 0 0  Down, Depressed, Hopeless 0 0 0  PHQ - 2 Score 0 0 0  Altered sleeping 0    Tired, decreased energy 0    Change in appetite 0    Feeling bad or failure about yourself  0    Trouble concentrating 0    Moving slowly or fidgety/restless 0    Suicidal thoughts 0    PHQ-9 Score 0    Difficult doing work/chores Not difficult at all        11/07/2023    9:56 AM 10/21/2021    8:42 AM  GAD 7 : Generalized Anxiety Score  Nervous, Anxious, on Edge 0 0  Control/stop worrying 0 0  Worry too much - different things 0 0  Trouble relaxing 0 0  Restless 0 0  Easily annoyed or irritable 0 0  Afraid - awful might happen 0 0  Total GAD 7 Score 0 0  Anxiety Difficulty Not difficult at all Not difficult at all     Assessment/  Plan: Kristine Keller here for annual physical exam.   Pure hypercholesterolemia - Plan: CMP14+EGFR, Lipid Panel, TSH  Colon cancer screening declined - Plan: CBC  Vaccination declined by patient  I have updated her chart with her over-the-counter supplements.  I counseled her at length today on colon cancer screening etc.  We are going to try and get her previous colonoscopies.  She apparently has had for between the ages of 69 and 89 which caused me to believe that she may have had some type of concerning polyp.  This seems to be very atypical screening intervals for a  normal colonoscopy.  If in fact colonoscopy was normal and given that she has had no symptomology and no first-degree relatives with colon cancer, we could consider Cologuard for screening if she desires.  She is a fairly healthy female I think longevity is likely in this patient.  Declines vaccinations.  Fasting labs collected today  Counseled on healthy lifestyle choices, including diet (rich in fruits, vegetables and lean meats and low in salt and simple carbohydrates) and exercise (at least 30 minutes of moderate physical activity daily).  Patient to follow up 1 year for CPE  Lidie Glade M. Nadine Counts, DO

## 2023-11-09 ENCOUNTER — Other Ambulatory Visit: Payer: Self-pay | Admitting: Family Medicine

## 2023-11-09 DIAGNOSIS — E7800 Pure hypercholesterolemia, unspecified: Secondary | ICD-10-CM

## 2023-12-05 ENCOUNTER — Ambulatory Visit (HOSPITAL_COMMUNITY)
Admission: RE | Admit: 2023-12-05 | Discharge: 2023-12-05 | Disposition: A | Discharge status: Home / Self Care | Payer: Self-pay | Source: Ambulatory Visit | Attending: Family Medicine | Admitting: Family Medicine

## 2023-12-05 DIAGNOSIS — E7800 Pure hypercholesterolemia, unspecified: Secondary | ICD-10-CM | POA: Insufficient documentation

## 2023-12-06 ENCOUNTER — Other Ambulatory Visit: Payer: Self-pay | Admitting: Family Medicine

## 2023-12-06 DIAGNOSIS — R931 Abnormal findings on diagnostic imaging of heart and coronary circulation: Secondary | ICD-10-CM

## 2023-12-06 DIAGNOSIS — E7800 Pure hypercholesterolemia, unspecified: Secondary | ICD-10-CM

## 2023-12-06 MED ORDER — ROSUVASTATIN CALCIUM 10 MG PO TABS: 10.0000 mg | ORAL_TABLET | Freq: Every day | ORAL | 3 refills | Status: AC

## 2023-12-07 ENCOUNTER — Telehealth: Payer: Self-pay

## 2023-12-07 NOTE — Telephone Encounter (Signed)
 Pt informed supplements are okay. LS

## 2023-12-07 NOTE — Telephone Encounter (Signed)
 Copied from CRM [id number]. Topic: Clinical - Prescription Issue >> Dec 07, 2023  9:38 AM Eunice Blase wrote: Reason for CRM: Pt called regarding rosuvastatin (CRESTOR) 10 MG tablet, can she continue taking multi vitamin and fish oil? Please call pt at [phone].

## 2023-12-26 ENCOUNTER — Telehealth: Payer: Self-pay

## 2023-12-26 NOTE — Telephone Encounter (Signed)
 Copied from CRM [id number]. Topic: Referral - Question >> Dec 26, 2023  9:19 AM Rennis Case wrote: Reason for CRM: Requesting to speak to Dr. Bonnell Butcher about an appt the cardiologist have called her about. Pt wants to know if is okay for patient to wait as long as theyre saying. Patient's appointment is not until 02/20/24.   Patient is concerned about the wait time and would like to speak to Dr. Bonnell Butcher.   Best call back number: [phone]

## 2023-12-26 NOTE — Telephone Encounter (Signed)
 Spoke to patient. All questions answered.

## 2024-01-04 ENCOUNTER — Other Ambulatory Visit: Payer: Self-pay | Admitting: Family Medicine

## 2024-01-04 DIAGNOSIS — N952 Postmenopausal atrophic vaginitis: Secondary | ICD-10-CM

## 2024-01-06 ENCOUNTER — Telehealth: Payer: Self-pay | Admitting: Family Medicine

## 2024-01-06 NOTE — Telephone Encounter (Signed)
 Patient aware and verbalized understanding.

## 2024-01-06 NOTE — Telephone Encounter (Signed)
 Patient will need to contact the [facility] as they are specialty and they will make her aware of any Co-Pays or Office Payments.

## 2024-01-06 NOTE — Telephone Encounter (Signed)
 Copied from CRM [id number]. Topic: Referral - Question >> Jan 06, 2024 10:34 AM Ivette P wrote: Reason for CRM: Pt wants to know if the referral to the [facility] will be covered under her new insurance. If not pt would like another referral to an office that will cover her new insurance with [employer]. Pt would like for someone to follow up with her about this matter.   Pt callback [phone]

## 2024-01-28 IMAGING — MG MM DIGITAL SCREENING BILAT W/ TOMO AND CAD
8 series · 9 of 24 positions shown · non-contrast
Comparison: Previous exam(s).

CLINICAL DATA: Screening.

EXAM:
DIGITAL SCREENING BILATERAL MAMMOGRAM WITH TOMOSYNTHESIS AND CAD
TECHNIQUE: Bilateral screening digital craniocaudal and mediolateral oblique
mammograms were obtained. Bilateral screening digital breast
tomosynthesis was performed. The images were evaluated with
computer-aided detection.

[L CC synth-2D]
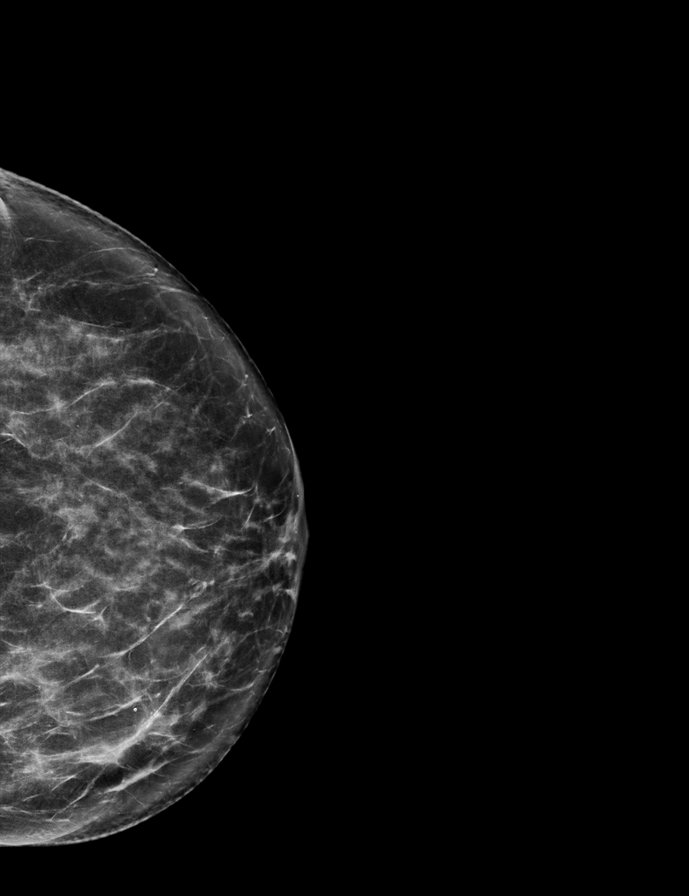

[L MLO synth-2D]
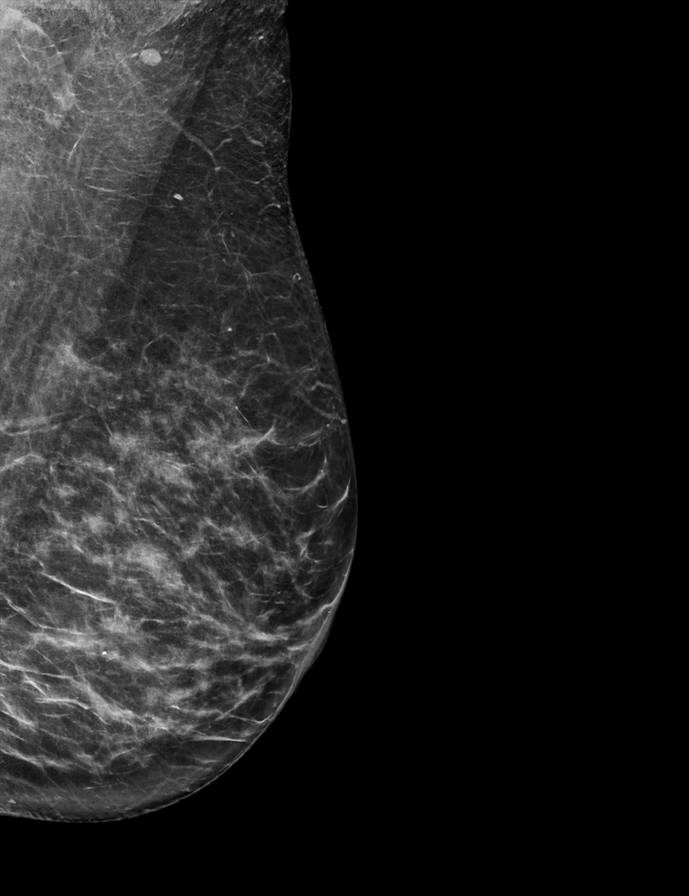

[R MLO synth-2D]
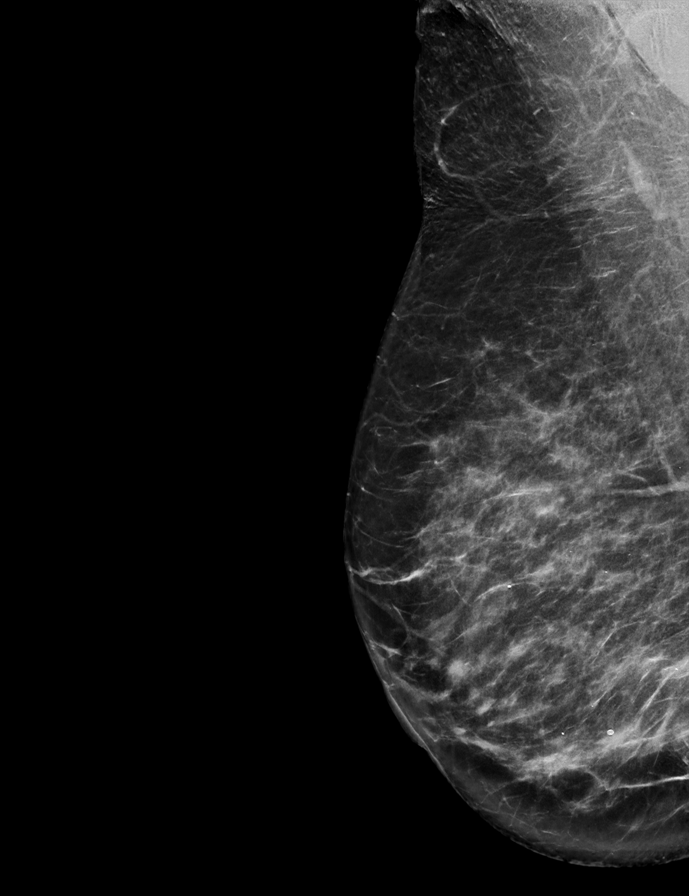

[R CC synth-2D]
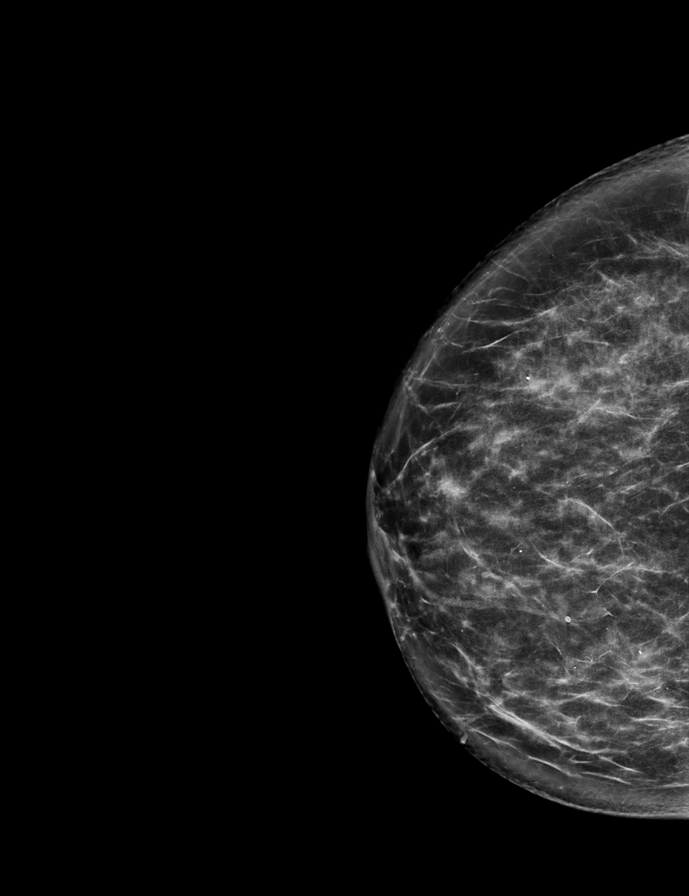

[L CC tomo · 2 of 69 frames shown]
[frame 23/69]
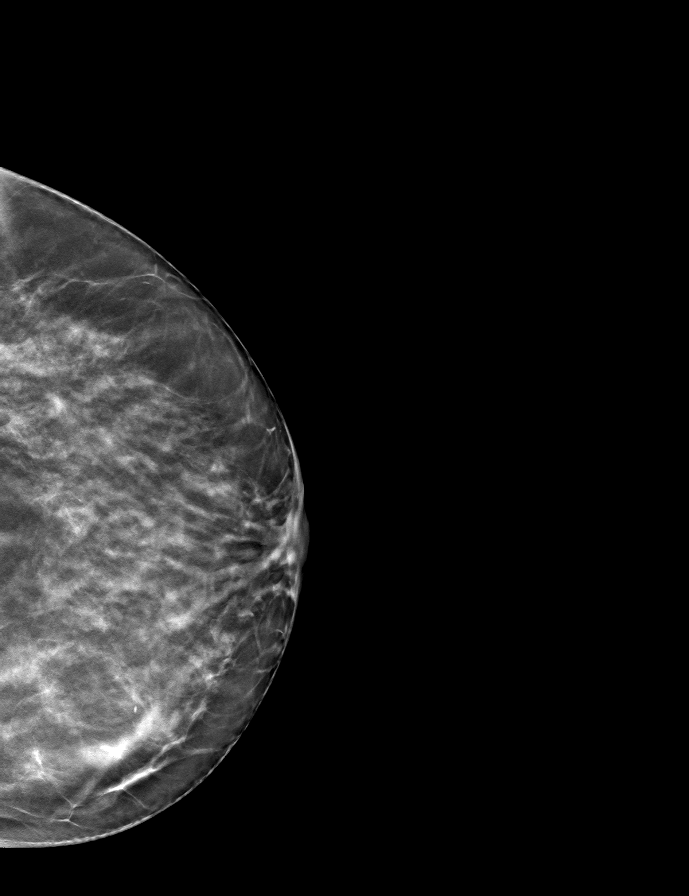
[frame 35/69]
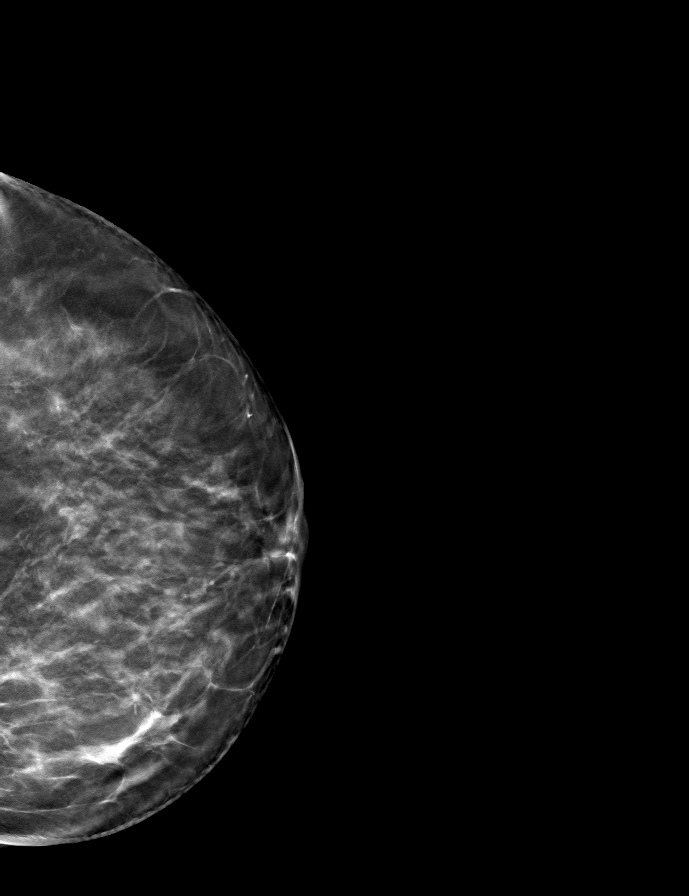

[L MLO tomo · tomo slice 35/69.0]
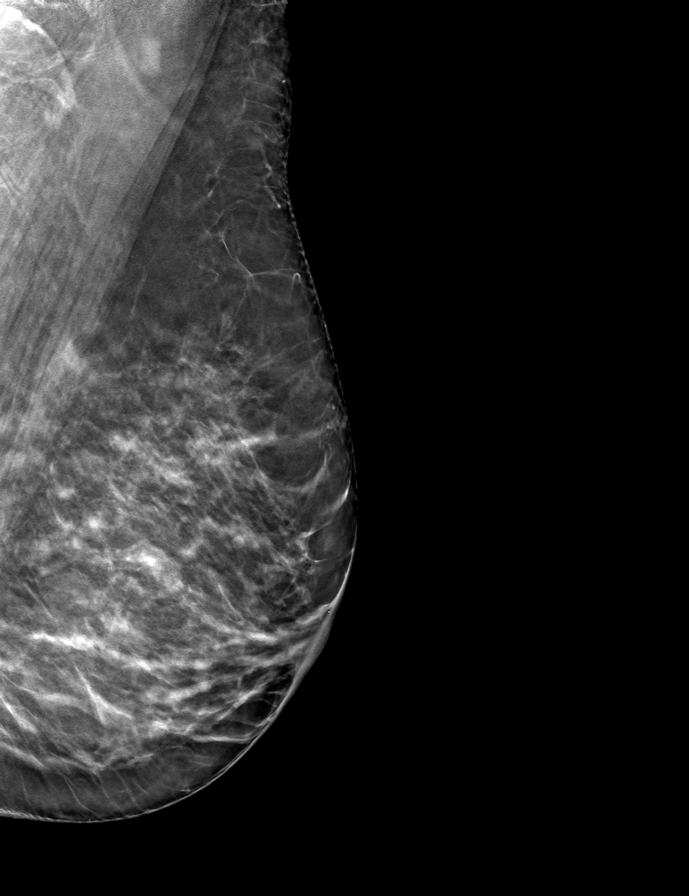

[R CC tomo · tomo slice 35/70.0]
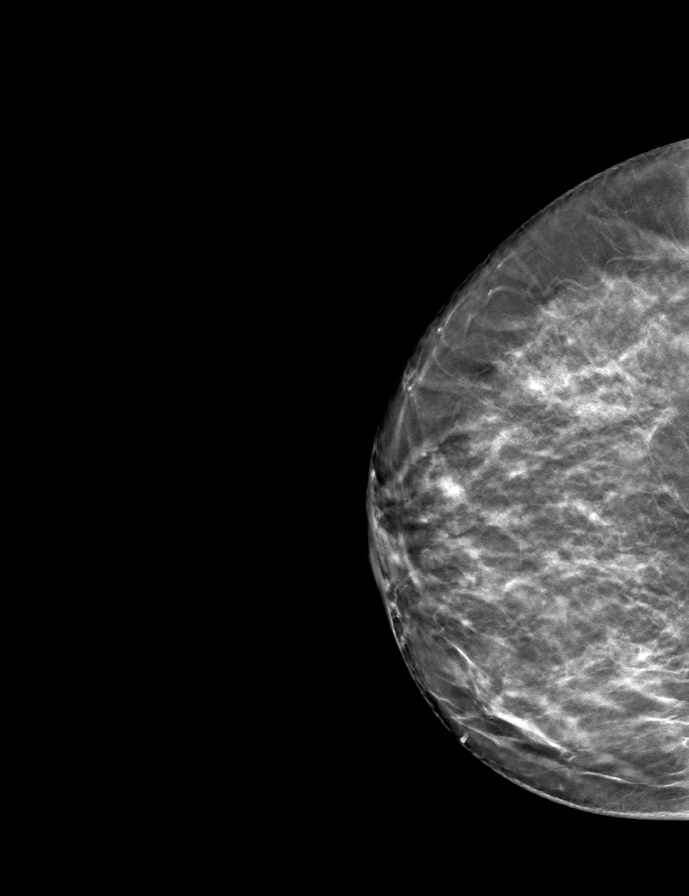

[R MLO tomo · tomo slice 37/74.0]
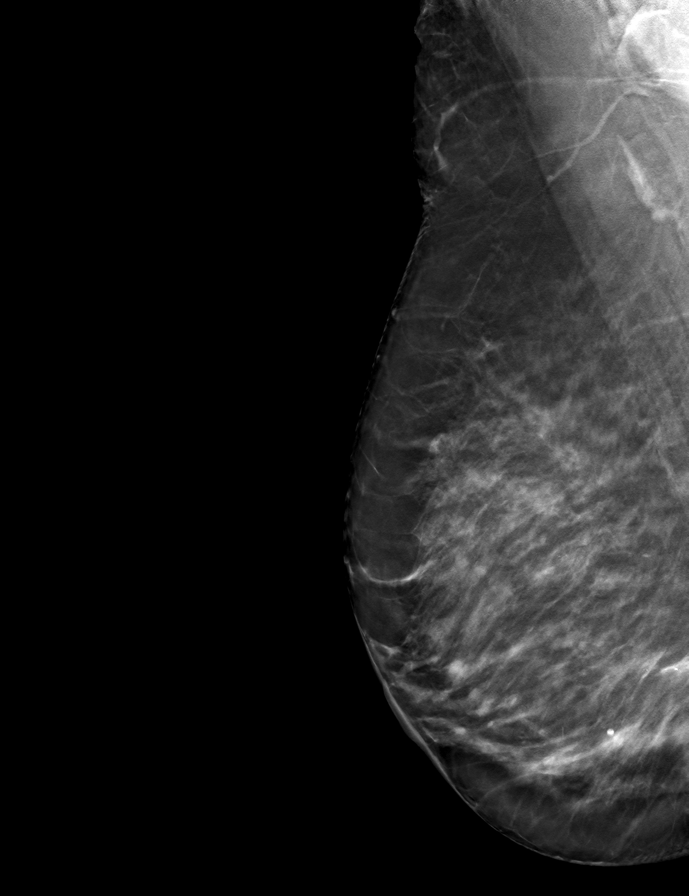

[9 of 24 positions shown; findings below may reference images not displayed]

ACR Breast Density Category c: The breast tissue is heterogeneously
dense, which may obscure small masses.
FINDINGS: There are no findings suspicious for malignancy.
IMPRESSION: No mammographic evidence of malignancy. A result letter of this
screening mammogram will be mailed directly to the patient.

RECOMMENDATION:
Screening mammogram in one year. (Code:[id number]-BC3)

BI-RADS CATEGORY  1: Negative.

## 2024-02-20 ENCOUNTER — Ambulatory Visit: Payer: Self-pay | Attending: Internal Medicine | Admitting: Internal Medicine

## 2024-02-20 ENCOUNTER — Encounter: Payer: Self-pay | Admitting: Internal Medicine

## 2024-02-20 VITALS — BP 138/88 | HR 80 | Ht 63.0 in | Wt 146.6 lb

## 2024-02-20 DIAGNOSIS — E7849 Other hyperlipidemia: Secondary | ICD-10-CM

## 2024-02-20 DIAGNOSIS — R0609 Other forms of dyspnea: Secondary | ICD-10-CM | POA: Diagnosis not present

## 2024-02-20 DIAGNOSIS — Z136 Encounter for screening for cardiovascular disorders: Secondary | ICD-10-CM

## 2024-02-20 DIAGNOSIS — R931 Abnormal findings on diagnostic imaging of heart and coronary circulation: Secondary | ICD-10-CM | POA: Diagnosis not present

## 2024-02-20 DIAGNOSIS — E785 Hyperlipidemia, unspecified: Secondary | ICD-10-CM | POA: Insufficient documentation

## 2024-02-20 NOTE — Patient Instructions (Addendum)
 Medication Instructions:  Your physician has recommended you make the following change in your medication:  Please take Aspirin 81 Mg daily   Labwork: None   Testing/Procedures: Your physician has requested that you have a lexiscan myoview. For further information please visit [email or web address] Please follow instruction sheet, as given. Your physician has requested that you have an echocardiogram. Echocardiography is a painless test that uses sound waves to create images of your heart. It provides your doctor with information about the size and shape of your heart and how well your heart's chambers and valves are working. This procedure takes approximately one hour. There are no restrictions for this procedure. Please do NOT wear cologne, perfume, aftershave, or lotions (deodorant is allowed). Please arrive 15 minutes prior to your appointment time.  Please note: We ask at that you not bring children with you during ultrasound (echo/ vascular) testing. Due to room size and safety concerns, children are not allowed in the ultrasound rooms during exams. Our front office staff cannot provide observation of children in our lobby area while testing is being conducted. An adult accompanying a patient to their appointment will only be allowed in the ultrasound room at the discretion of the ultrasound technician under special circumstances. We apologize for any inconvenience.  Follow-Up: Your physician recommends that you schedule a follow-up appointment in: Pending results   Any Other Special Instructions Will Be Listed Below (If Applicable).  If you need a refill on your cardiac medications before your next appointment, please call your pharmacy.

## 2024-02-20 NOTE — Progress Notes (Signed)
 Cardiology Office Note  Date: 02/20/2024   ID: Kristine Keller, DOB 1950-11-25, MRN [id number]  PCP:  Jolinda Norene HERO, DO  Cardiologist:  Kedron Uno P Fredericka Bottcher, MD Electrophysiologist:  None   History of Present Illness: Kristine Keller is a 73 y.o. female  Referred to cardiology clinic for evaluation of elevated coronary calcium  score.  Reviewed and discussed CT coronary calcium  score results with the patient.  Does not have angina but has DOE.  No exertional dizziness, syncope, palpitations or leg swelling.  Past Medical History:  Diagnosis Date   Pure hypercholesterolemia     Past Surgical History:  Procedure Laterality Date   ABDOMINAL HYSTERECTOMY      Current Outpatient Medications  Medication Sig Dispense Refill   aspirin EC 81 MG tablet Take 81 mg by mouth daily. Swallow whole.     estradiol  (ESTRACE ) 0.1 MG/GM vaginal cream FINGER APPLICATION TO THE VAGINA AT BEDTIME AS DIRECTED 42.5 g PRN   Multiple Vitamin (MULTIVITAMIN) tablet Take 1 tablet by mouth daily.     Omega-3 Fatty Acids (FISH OIL) 1200 MG CAPS Take 1 capsule by mouth daily.     rosuvastatin  (CRESTOR ) 10 MG tablet Take 1 tablet (10 mg total) by mouth at bedtime. For cholesterol 90 tablet 3   No current facility-administered medications for this visit.   Allergies:  Codeine and Sulfa antibiotics   Social History: The patient  reports that she has never smoked. She has never used smokeless tobacco. She reports that she does not drink alcohol and does not use drugs.   Family History: The patient's family history includes Autoimmune disease in her daughter; COPD in her brother and father; Cancer in her brother and mother; Hypertension in her brother and mother.   ROS:  Please see the history of present illness. Otherwise, complete review of systems is positive for none  All other systems are reviewed and negative.   Physical Exam: VS:  BP 138/88   Pulse 80   Ht 5' 3 (1.6 m)   Wt 146 lb 9.6 oz  (66.5 kg)   SpO2 96%   BMI 25.97 kg/m , BMI Body mass index is 25.97 kg/m.  Wt Readings from Last 3 Encounters:  02/20/24 146 lb 9.6 oz (66.5 kg)  11/07/23 145 lb 9.6 oz (66 kg)  09/20/23 133 lb (60.3 kg)    General: Patient appears comfortable at rest. HEENT: Conjunctiva and lids normal, oropharynx clear with moist mucosa. Neck: Supple, no elevated JVP or carotid bruits, no thyromegaly. Lungs: Clear to auscultation, nonlabored breathing at rest. Cardiac: Regular rate and rhythm, no S3 or significant systolic murmur, no pericardial rub. Abdomen: Soft, nontender, no hepatomegaly, bowel sounds present, no guarding or rebound. Extremities: No pitting edema, distal pulses 2+. Skin: Warm and dry. Musculoskeletal: No kyphosis. Neuropsychiatric: Alert and oriented x3, affect grossly appropriate.  Recent Labwork: 11/07/2023: ALT 20; AST 27; BUN 11; Creatinine, Ser 0.80; Hemoglobin 14.1; Platelets 168; Potassium 4.2; Sodium 140; TSH 1.420     Component Value Date/Time   CHOL 228 (H) 11/07/2023 1020   TRIG 115 11/07/2023 1020   HDL 51 11/07/2023 1020   CHOLHDL 4.5 (H) 11/07/2023 1020   LDLCALC 156 (H) 11/07/2023 1020    Assessment and Plan:  Elevated coronary calcium  score: Coronary calcium  score is 261 (84th percentile for age and sex matched control), 163 in RCA and 9018 LAD.  Due to elevated calcium  score for her age (84th percentile), start aspirin 81 mg once daily (currently taking aspirin  twice a week).  Continue rosuvastatin  10 mg nightly.  Goal LDL is <70.  She is not having angina but does have DOE.  Will obtain echocardiogram and Lexiscan.  Heart healthy diet and exercise recommendations provided.  Discussed symptoms of MI and CAD with the patient.  HLD, not at goal: LDL 156 in March 2025, elevated.  TG 115, normal.  Goal LDL should be less than 70 due to elevated calcium  score for her age.  Continue rosuvastatin  10 mg at bedtime.  Can repeat lipid panel with PCP.        Medication Adjustments/Labs and Tests Ordered: Current medicines are reviewed at length with the patient today.  Concerns regarding medicines are outlined above.    Disposition:  Follow up pending results  Signed Sinthia Karabin Priya Jahmad Petrich, MD, 02/20/2024 3:11 PM    [facility] [address] [place], [place], [place] [address]

## 2024-02-27 ENCOUNTER — Ambulatory Visit (HOSPITAL_COMMUNITY)
Admission: RE | Admit: 2024-02-27 | Discharge: 2024-02-27 | Disposition: A | Discharge status: Home / Self Care | Source: Ambulatory Visit | Attending: Internal Medicine | Admitting: Internal Medicine

## 2024-02-27 ENCOUNTER — Ambulatory Visit (HOSPITAL_BASED_OUTPATIENT_CLINIC_OR_DEPARTMENT_OTHER)
Admission: RE | Admit: 2024-02-27 | Discharge: 2024-02-27 | Disposition: A | Discharge status: Home / Self Care | Source: Ambulatory Visit | Attending: Internal Medicine | Admitting: Internal Medicine

## 2024-02-27 DIAGNOSIS — R0609 Other forms of dyspnea: Secondary | ICD-10-CM

## 2024-02-27 LAB — NM MYOCAR MULTI W/SPECT W/WALL MOTION / EF
Base ST Depression (mm): 0 mm
LV dias vol: 71 mL (ref 46–106)
LV sys vol: 20 mL (ref 3.8–5.2)
Nuc Stress EF: 71 %
RATE: 0.3
Rest Nuclear Isotope Dose: 11 mCi
SDS: 1
SRS: 0
SSS: 1
ST Depression (mm): 0 mm
Stress Nuclear Isotope Dose: 33 mCi
TID: 1.11

## 2024-02-27 MED ORDER — REGADENOSON 0.4 MG/5ML IV SOLN
INTRAVENOUS | Status: AC
  Administered 2024-02-27: 0.4 mg via INTRAVENOUS
  Filled 2024-02-27: qty 5

## 2024-02-27 MED ORDER — SODIUM CHLORIDE FLUSH 0.9 % IV SOLN
INTRAVENOUS | Status: AC
  Filled 2024-02-27: qty 10

## 2024-02-27 MED ORDER — TECHNETIUM TC 99M TETROFOSMIN IV KIT
30.0000 | PACK | Freq: Once | INTRAVENOUS | Status: AC | PRN
  Administered 2024-02-27: 33 via INTRAVENOUS

## 2024-02-27 MED ORDER — TECHNETIUM TC 99M TETROFOSMIN IV KIT
10.0000 | PACK | Freq: Once | INTRAVENOUS | Status: AC | PRN
  Administered 2024-02-27: 11 via INTRAVENOUS

## 2024-02-28 ENCOUNTER — Other Ambulatory Visit: Payer: Self-pay | Admitting: Physician Assistant

## 2024-02-28 ENCOUNTER — Ambulatory Visit: Payer: Self-pay | Admitting: Internal Medicine

## 2024-02-28 NOTE — Progress Notes (Signed)
     Kristine Keller presented for a Lexiscan nuclear stress test on 02/27/2024.  I Lorette CINDERELLA Kapur, PA-C, provided direct supervision and was present during the stress portion of the study today, which was completed without significant symptoms, immediate complications, or acute ST/T changes on ECG.  Stress imaging is pending at this time.  Preliminary ECG findings may be listed in the chart, but the stress test result will not be finalized until perfusion imaging is complete.  Lorette CINDERELLA Kapur, PA-C  02/28/2024, 4:23 PM

## 2024-02-28 NOTE — Telephone Encounter (Signed)
-----   Message from Vishnu P Mallipeddi sent at 02/28/2024  9:48 AM EDT ----- Abnormal stress test but low risk study.  Schedule follow-up in 6 months. ----- Message ----- From: Alvan Dorn FALCON, MD Sent: 02/27/2024   4:51 PM EDT To: Vishnu P Mallipeddi, MD

## 2024-02-28 NOTE — Telephone Encounter (Signed)
 The patient has been notified of the result and verbalized understanding.  All questions (if any) were answered. Littie CHRISTELLA Croak, CMA 02/28/2024 10:56 AM

## 2024-03-13 ENCOUNTER — Ambulatory Visit: Attending: Internal Medicine

## 2024-03-13 DIAGNOSIS — R0609 Other forms of dyspnea: Secondary | ICD-10-CM | POA: Diagnosis not present

## 2024-03-13 LAB — ECHOCARDIOGRAM COMPLETE
AR max vel: 2.47 cm2
AV Area VTI: 2.56 cm2
AV Area mean vel: 2.42 cm2
AV Mean grad: 4 mmHg
AV Peak grad: 8.1 mmHg
Ao pk vel: 1.42 m/s
Area-P 1/2: 2.21 cm2
Calc EF: 53.3 %
MV VTI: 4.44 cm2
S' Lateral: 3.7 cm
Single Plane A2C EF: 54 %
Single Plane A4C EF: 57.1 %

## 2024-03-19 ENCOUNTER — Telehealth: Payer: Self-pay | Admitting: Internal Medicine

## 2024-03-19 NOTE — Telephone Encounter (Signed)
 Patient made aware that report needs to resulted by Dr.Mallipeddi   I diD tell her the EF-(pumping function) of the heart is normal at 60-65%

## 2024-03-19 NOTE — Telephone Encounter (Signed)
 Patient called to follow-up on her test results.

## 2024-03-20 NOTE — Telephone Encounter (Signed)
 The patient has been notified of the result and verbalized understanding.  All questions (if any) were answered. Bernett Dorothyann LABOR, RN 03/20/2024 2:37 PM

## 2024-07-04 ENCOUNTER — Encounter: Payer: Self-pay | Admitting: Family Medicine

## 2024-07-04 ENCOUNTER — Ambulatory Visit: Admitting: Family Medicine

## 2024-07-04 ENCOUNTER — Ambulatory Visit: Payer: Self-pay

## 2024-07-04 VITALS — BP 154/91 | HR 106 | Temp 97.2°F | Ht 63.0 in | Wt 142.8 lb

## 2024-07-04 DIAGNOSIS — R059 Cough, unspecified: Secondary | ICD-10-CM

## 2024-07-04 DIAGNOSIS — R519 Headache, unspecified: Secondary | ICD-10-CM | POA: Diagnosis not present

## 2024-07-04 DIAGNOSIS — R0989 Other specified symptoms and signs involving the circulatory and respiratory systems: Secondary | ICD-10-CM

## 2024-07-04 DIAGNOSIS — R0981 Nasal congestion: Secondary | ICD-10-CM

## 2024-07-04 DIAGNOSIS — J189 Pneumonia, unspecified organism: Secondary | ICD-10-CM | POA: Diagnosis not present

## 2024-07-04 MED ORDER — AMOXICILLIN-POT CLAVULANATE 875-125 MG PO TABS: 1.0000 | ORAL_TABLET | Freq: Two times a day (BID) | ORAL | 0 refills | Status: DC

## 2024-07-04 MED ORDER — AZITHROMYCIN 250 MG PO TABS: ORAL_TABLET | ORAL | 0 refills | Status: AC

## 2024-07-04 MED ORDER — GUAIFENESIN ER 600 MG PO TB12: 600.0000 mg | ORAL_TABLET | Freq: Two times a day (BID) | ORAL | 0 refills | Status: AC

## 2024-07-04 MED ORDER — PREDNISONE 20 MG PO TABS: 40.0000 mg | ORAL_TABLET | Freq: Every day | ORAL | 0 refills | Status: AC

## 2024-07-04 MED ORDER — ALBUTEROL SULFATE HFA 108 (90 BASE) MCG/ACT IN AERS
2.0000 | INHALATION_SPRAY | Freq: Four times a day (QID) | RESPIRATORY_TRACT | 0 refills | Status: AC | PRN
Start: 2024-07-04 — End: ?

## 2024-07-04 NOTE — Progress Notes (Signed)
 Subjective:  Patient ID: Kristine Keller, female    DOB: [birth year], 74 y.o.   MRN: [id number]  Patient Care Team: Jolinda Norene HERO, DO as PCP - General (Family Medicine) Mallipeddi, Diannah SQUIBB, MD as PCP - Cardiology (Cardiology)   Chief Complaint:  Cough, Nasal Congestion, and Headache (X 6 days- states last night her nose was draining blood.  OTC tussin )   HPI: Kristine Keller is a 73 y.o. female presenting on 07/04/2024 for Cough, Nasal Congestion, and Headache (X 6 days- states last night her nose was draining blood.  OTC tussin )    Kristine Keller is a 73 year old female who presents with worsening cough and congestion.  Cough and sputum production - Worsening cough since last Thursday, initially mild throat irritation - Cough is persistent and more pronounced at night, causing frequent awakenings - Sputum is dark greenish-brown with blood - No use of Mucinex for symptom relief  Nasal congestion and drainage - Significant nasal congestion and drainage last night - Blood-tinged mucus present - Headaches and sensation of congestion in nasal passages - No nasal discharge observed  Fatigue and dyspnea - Lack of energy and fatigue, worsening over the past week - Shortness of breath with minimal exertion, such as walking short distances  Symptom management - Taking trazodone and sinus pills for symptom relief - Drinking large amounts of water, coffee, and Doze to Go's - Avoiding sodas  Allergies - Allergic to sulfa drugs - Not currently taking any medications containing sulfa          Relevant past medical, surgical, family, and social history reviewed and updated as indicated.  Allergies and medications reviewed and updated. Data reviewed: Chart in Epic.   Past Medical History:  Diagnosis Date   Pure hypercholesterolemia     Past Surgical History:  Procedure Laterality Date   ABDOMINAL HYSTERECTOMY      Social History   Socioeconomic History    Marital status: Married    Spouse name: Richard   Number of children: 3   Years of education: 10   Highest education level: 10th grade  Occupational History   Occupation: retired  Tobacco Use   Smoking status: Never   Smokeless tobacco: Never  Vaping Use   Vaping status: Never Used  Substance and Sexual Activity   Alcohol use: Never   Drug use: Never   Sexual activity: Not Currently  Other Topics Concern   Not on file  Social History Narrative   Daughter, Kristine Keller   Her grandson and his 2 disabled children live with her (one with epilepsy, other with autism)-born 2016 & 2017   Likes minimally invasive medical approach.   Social Drivers of Corporate Investment Banker Strain: Low Risk  (09/20/2023)   Overall Financial Resource Strain (CARDIA)    Difficulty of Paying Living Expenses: Not hard at all  Food Insecurity: No Food Insecurity (09/20/2023)   Hunger Vital Sign    Worried About Running Out of Food in the Last Year: Never true    Ran Out of Food in the Last Year: Never true  Transportation Needs: No Transportation Needs (09/20/2023)   PRAPARE - Administrator, Civil Service (Medical): No    Lack of Transportation (Non-Medical): No  Physical Activity: Sufficiently Active (09/20/2023)   Exercise Vital Sign    Days of Exercise per Week: 5 days    Minutes of Exercise per Session: 30 min  Stress: No Stress Concern Present (09/20/2023)  [employer] of Occupational Health - Occupational Stress Questionnaire    Feeling of Stress : Not at all  Social Connections: Moderately Isolated (09/20/2023)   Social Connection and Isolation Panel    Frequency of Communication with Friends and Family: More than three times a week    Frequency of Social Gatherings with Friends and Family: Three times a week    Attends Religious Services: Never    Active Member of Clubs or Organizations: No    Attends Banker Meetings: Never    Marital Status: Married  Careers Information Officer Violence: Not At Risk (09/20/2023)   Humiliation, Afraid, Rape, and Kick questionnaire    Fear of Current or Ex-Partner: No    Emotionally Abused: No    Physically Abused: No    Sexually Abused: No    Outpatient Encounter Medications as of 07/04/2024  Medication Sig   albuterol (VENTOLIN HFA) 108 (90 Base) MCG/ACT inhaler Inhale 2 puffs into the lungs every 6 (six) hours as needed for wheezing or shortness of breath.   amoxicillin-clavulanate (AUGMENTIN) 875-125 MG tablet Take 1 tablet by mouth 2 (two) times daily.   aspirin EC 81 MG tablet Take 81 mg by mouth daily. Swallow whole.   azithromycin (ZITHROMAX) 250 MG tablet Take 2 tablets on day 1, then 1 tablet daily on days 2 through 5   estradiol  (ESTRACE ) 0.1 MG/GM vaginal cream FINGER APPLICATION TO THE VAGINA AT BEDTIME AS DIRECTED   guaiFENesin (MUCINEX) 600 MG 12 hr tablet Take 1 tablet (600 mg total) by mouth 2 (two) times daily for 10 days.   Multiple Vitamin (MULTIVITAMIN) tablet Take 1 tablet by mouth daily.   Omega-3 Fatty Acids (FISH OIL) 1200 MG CAPS Take 1 capsule by mouth daily.   predniSONE (DELTASONE) 20 MG tablet Take 2 tablets (40 mg total) by mouth daily with breakfast for 5 days.   rosuvastatin  (CRESTOR ) 10 MG tablet Take 1 tablet (10 mg total) by mouth at bedtime. For cholesterol   No facility-administered encounter medications on file as of 07/04/2024.    Allergies  Allergen Reactions   Codeine Other (See Comments)   Sulfa Antibiotics Anaphylaxis    Pertinent ROS per HPI, otherwise unremarkable      Objective:  BP (!) 154/91   Pulse (!) 106   Temp (!) 97.2 F (36.2 C)   Ht 5' 3 (1.6 m)   Wt 142 lb 12.8 oz (64.8 kg)   SpO2 93%   BMI 25.30 kg/m    Wt Readings from Last 3 Encounters:  07/04/24 142 lb 12.8 oz (64.8 kg)  02/20/24 146 lb 9.6 oz (66.5 kg)  11/07/23 145 lb 9.6 oz (66 kg)    Physical Exam Vitals and nursing note reviewed.  Constitutional:      General: She is not in acute  distress.    Appearance: She is well-developed and normal weight. She is ill-appearing. She is not toxic-appearing or diaphoretic.  HENT:     Head: Normocephalic and atraumatic.     Right Ear: A middle ear effusion is present. Tympanic membrane is not erythematous.     Left Ear: A middle ear effusion is present. Tympanic membrane is not erythematous.     Nose: Congestion present.     Mouth/Throat:     Lips: Pink.     Mouth: Mucous membranes are moist.     Pharynx: Posterior oropharyngeal erythema and postnasal drip present. No oropharyngeal exudate.  Eyes:     Pupils: Pupils are equal,  round, and reactive to light.  Cardiovascular:     Rate and Rhythm: Normal rate and regular rhythm.     Heart sounds: Normal heart sounds.  Pulmonary:     Effort: Pulmonary effort is normal. No respiratory distress.     Breath sounds: Examination of the right-upper field reveals wheezing. Examination of the right-middle field reveals wheezing. Examination of the right-lower field reveals wheezing and rhonchi. Wheezing and rhonchi present.  Musculoskeletal:     Cervical back: Normal range of motion and neck supple.  Neurological:     Mental Status: She is alert and oriented to person, place, and time.  Psychiatric:        Mood and Affect: Mood normal.        Behavior: Behavior normal. Behavior is cooperative.        Thought Content: Thought content normal.        Judgment: Judgment normal.      Results for orders placed or performed in visit on 03/13/24  ECHOCARDIOGRAM COMPLETE   Collection Time: 03/13/24  8:15 AM  Result Value Ref Range   S' Lateral 3.70 cm   Area-P 1/2 2.21 cm2   Single Plane A2C EF 54.0 %   Single Plane A4C EF 57.1 %   Calc EF 53.3 %   AV Area VTI 2.56 cm2   MV VTI 4.44 cm2   AV Area mean vel 2.42 cm2   AR max vel 2.47 cm2   Ao pk vel 1.42 m/s   AV Mean grad 4.0 mmHg   AV Peak grad 8.1 mmHg   Est EF 60 - 65%        Pertinent labs & imaging results that were  available during my care of the patient were reviewed by me and considered in my medical decision making.  Assessment & Plan:  Kristine Keller was seen today for cough, nasal congestion and headache.  Diagnoses and all orders for this visit:  Cough in adult -     amoxicillin-clavulanate (AUGMENTIN) 875-125 MG tablet; Take 1 tablet by mouth 2 (two) times daily. -     azithromycin (ZITHROMAX) 250 MG tablet; Take 2 tablets on day 1, then 1 tablet daily on days 2 through 5 -     guaiFENesin (MUCINEX) 600 MG 12 hr tablet; Take 1 tablet (600 mg total) by mouth 2 (two) times daily for 10 days. -     predniSONE (DELTASONE) 20 MG tablet; Take 2 tablets (40 mg total) by mouth daily with breakfast for 5 days. -     albuterol (VENTOLIN HFA) 108 (90 Base) MCG/ACT inhaler; Inhale 2 puffs into the lungs every 6 (six) hours as needed for wheezing or shortness of breath.  Chest congestion -     amoxicillin-clavulanate (AUGMENTIN) 875-125 MG tablet; Take 1 tablet by mouth 2 (two) times daily. -     azithromycin (ZITHROMAX) 250 MG tablet; Take 2 tablets on day 1, then 1 tablet daily on days 2 through 5 -     guaiFENesin (MUCINEX) 600 MG 12 hr tablet; Take 1 tablet (600 mg total) by mouth 2 (two) times daily for 10 days. -     predniSONE (DELTASONE) 20 MG tablet; Take 2 tablets (40 mg total) by mouth daily with breakfast for 5 days.  Nasal congestion -     amoxicillin-clavulanate (AUGMENTIN) 875-125 MG tablet; Take 1 tablet by mouth 2 (two) times daily. -     azithromycin (ZITHROMAX) 250 MG tablet; Take 2 tablets on day 1,  then 1 tablet daily on days 2 through 5 -     guaiFENesin (MUCINEX) 600 MG 12 hr tablet; Take 1 tablet (600 mg total) by mouth 2 (two) times daily for 10 days. -     predniSONE (DELTASONE) 20 MG tablet; Take 2 tablets (40 mg total) by mouth daily with breakfast for 5 days.  Sinus headache -     amoxicillin-clavulanate (AUGMENTIN) 875-125 MG tablet; Take 1 tablet by mouth 2 (two) times daily. -      azithromycin (ZITHROMAX) 250 MG tablet; Take 2 tablets on day 1, then 1 tablet daily on days 2 through 5 -     guaiFENesin (MUCINEX) 600 MG 12 hr tablet; Take 1 tablet (600 mg total) by mouth 2 (two) times daily for 10 days. -     predniSONE (DELTASONE) 20 MG tablet; Take 2 tablets (40 mg total) by mouth daily with breakfast for 5 days.  Community acquired pneumonia of right upper lobe of lung -     amoxicillin-clavulanate (AUGMENTIN) 875-125 MG tablet; Take 1 tablet by mouth 2 (two) times daily. -     azithromycin (ZITHROMAX) 250 MG tablet; Take 2 tablets on day 1, then 1 tablet daily on days 2 through 5 -     guaiFENesin (MUCINEX) 600 MG 12 hr tablet; Take 1 tablet (600 mg total) by mouth 2 (two) times daily for 10 days. -     predniSONE (DELTASONE) 20 MG tablet; Take 2 tablets (40 mg total) by mouth daily with breakfast for 5 days. -     albuterol (VENTOLIN HFA) 108 (90 Base) MCG/ACT inhaler; Inhale 2 puffs into the lungs every 6 (six) hours as needed for wheezing or shortness of breath.      Right lower lobe pneumonia with cough, blood-tinged sputum, nasal congestion, headache, and respiratory symptoms Symptoms have persisted for over a week, including cough, blood-tinged sputum, nasal congestion, headache, and shortness of breath. Significant bronchial wheezing in the right lobe suggests pneumonia. Differential diagnosis includes community-acquired pneumonia. Decision to treat with antibiotics and supportive care without chest x-ray due to symptomatology and treatment plan. - Prescribed prednisone 40 mg daily for 5 days to reduce congestion and wheezing. - Prescribed Mucinex twice daily with plenty of water for 10 days to thin mucus. - Prescribed azithromycin (Zithromax) with two tablets on the first day, followed by one tablet daily until finished. - Prescribed Augmentin twice daily until finished. - Advised to monitor symptoms and report if not improved by Friday or if symptoms worsen, such  as increased fever, worsening shortness of breath, or fatigue.          Continue all other maintenance medications.  Follow up plan: Return in about 2 weeks (around 07/18/2024), or if symptoms worsen or fail to improve, for CAP recheck.   Continue healthy lifestyle choices, including diet (rich in fruits, vegetables, and lean proteins, and low in salt and simple carbohydrates) and exercise (at least 30 minutes of moderate physical activity daily).  Educational handout given for CAP  The above assessment and management plan was discussed with the patient. The patient verbalized understanding of and has agreed to the management plan. Patient is aware to call the clinic if they develop any new symptoms or if symptoms persist or worsen. Patient is aware when to return to the clinic for a follow-up visit. Patient educated on when it is appropriate to go to the emergency department.   Rosaline Bruns, FNP-C [facility] [phone]

## 2024-07-04 NOTE — Telephone Encounter (Signed)
 Appt made.

## 2024-07-04 NOTE — Telephone Encounter (Signed)
 FYI Only or Action Required?: FYI only for provider: appointment scheduled on 07/04/2024.  Patient was last seen in primary care on 11/07/2023 by Jolinda Norene HERO, DO.  Called Nurse Triage reporting Cough.  Symptoms began several days ago.  Interventions attempted: OTC medications: Tussion and sinus meds.  Symptoms are: gradually worsening.  Triage Disposition: See HCP Within 4 Hours (Or PCP Triage)  Patient/caregiver understands and will follow disposition?: Yes         Copied from CRM #[id number]. Topic: Clinical - Red Word Triage >> Jul 04, 2024  8:03 AM Tobias CROME wrote: Red Word that prompted transfer to Nurse Triage: last 6 days, really bad cough, headache and pressure in nasal area, starting to cough up brownish color stuff, nose started bleeding last night Reason for Disposition  Wheezing is present  Answer Assessment - Initial Assessment Questions Sinus medication and tussion medication for symptoms. Negative Covid test at home.   1. ONSET: When did the cough begin?      6 days ago  2. SEVERITY: How bad is the cough today?      She states she has to cough every 45 minutes  3. SPUTUM: Describe the color of your sputum (e.g., none, dry cough; clear, white, yellow, green)     Brownish color 4. HEMOPTYSIS: Are you coughing up any blood? If Yes, ask: How much? (e.g., flecks, streaks, tablespoons, etc.)     Mixed up with phlegm little specks of blood  5. DIFFICULTY BREATHING: Are you having difficulty breathing? If Yes, ask: How bad is it? (e.g., mild, moderate, severe)      Denies  6. FEVER: Do you have a fever? If Yes, ask: What is your temperature, how was it measured, and when did it start?     Denies  7. OTHER SYMPTOMS: Do you have any other symptoms? (e.g., runny nose, wheezing, chest pain)       Headache, pressure in nasal area, chest congestion, fatigue, wheezing  Protocols used: Cough - Acute Productive-A-AH

## 2024-07-09 ENCOUNTER — Ambulatory Visit: Payer: Self-pay

## 2024-07-09 NOTE — Telephone Encounter (Signed)
 Pt has been taking abx for about 5 days and is inquiring about contagiousness. 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 Pt states that s/s are improving and that she has been on abx since last Wednesday.   Copied from CRM [id number]. Topic: Clinical - Medical Advice >> Jul 09, 2024 12:17 PM Antwanette L wrote: Reason for CRM: The patient was seen last Wednesday and diagnosed with pneumonia. She is now asking when it is safe to be around others.The patient is requesting a callback at [phone]

## 2024-07-09 NOTE — Telephone Encounter (Signed)
 Returned patient call to advise that if symptoms are improving and been fever free without medication for at lease 24 hours she should be fine to be around her grandchildren. Patient verbalized understanding.

## 2024-08-16 ENCOUNTER — Encounter: Payer: Self-pay | Admitting: Internal Medicine

## 2024-08-16 ENCOUNTER — Ambulatory Visit: Attending: Internal Medicine | Admitting: Internal Medicine

## 2024-08-16 VITALS — BP 164/90 | HR 69 | Ht 63.0 in | Wt 146.0 lb

## 2024-08-16 DIAGNOSIS — R9439 Abnormal result of other cardiovascular function study: Secondary | ICD-10-CM | POA: Insufficient documentation

## 2024-08-16 DIAGNOSIS — Z136 Encounter for screening for cardiovascular disorders: Secondary | ICD-10-CM | POA: Diagnosis not present

## 2024-08-16 DIAGNOSIS — R931 Abnormal findings on diagnostic imaging of heart and coronary circulation: Secondary | ICD-10-CM | POA: Diagnosis not present

## 2024-08-16 DIAGNOSIS — E7849 Other hyperlipidemia: Secondary | ICD-10-CM

## 2024-08-16 MED ORDER — METOPROLOL TARTRATE 100 MG PO TABS: 100.0000 mg | ORAL_TABLET | Freq: Once | ORAL | 0 refills | Status: AC

## 2024-08-16 NOTE — Patient Instructions (Addendum)
 Medication Instructions:  Your physician recommends that you continue on your current medications as directed. Please refer to the Current Medication list given to you today.   Labwork: BMET in 1-2 weeks prior to Coronary CTA at [facility]  Lipid Panel  Testing/Procedures:   Your cardiac CT will be scheduled at one of the below locations:   [facility] [address] [place], [place] [address] [phone] (Severe contrast allergies only)  OR   [facility] [address]  [place], [place] [address]   If scheduled at [facility], please arrive at the [facility] Entrance (Entrance C2) of [facility] 30 minutes prior to test start time. You can use the FREE valet parking offered at entrance C (encouraged to control the heart rate for the test)  Proceed to the [facility] Radiology Department (first floor) to check-in and test prep.  All radiology patients and guests should use entrance C2 at [facility], accessed from [facility], even though the hospital's physical address listed is [address]  If scheduled at the Heart and Vascular Tower at [address], please enter the parking lot using the [address] entrance and use the FREE valet service at the patient drop-off area. Enter the building and check-in with registration on the main floor.   There is spacious parking and easy access to the radiology department from the [facility] entrance. Please enter here and check-in with the desk attendant.   If scheduled at [facility], please arrive 30 minutes early for check-in and test prep.  Please follow these instructions carefully (unless otherwise directed):  An IV will be required for this test and Nitroglycerin will be given.  Hold all erectile dysfunction medications at least 3 days (72 hrs) prior to test. (Ie viagra, cialis, sildenafil, tadalafil,  etc)   On the Night Before the Test: Be sure to Drink plenty of water. Do not consume any caffeinated/decaffeinated beverages or chocolate 12 hours prior to your test. Do not take any antihistamines 12 hours prior to your test.  If the patient has contrast allergy: No allergy   On the Day of the Test: Drink plenty of water until 1 hour prior to the test. Do not eat any food 1 hour prior to test. You may take your regular medications prior to the test.  Take Metoprolol  100 mg(Lopressor ) two hours prior to test. If you take Furosemide/Hydrochlorothiazide/Spironolactone/Chlorthalidone, please HOLD on the morning of the test. Patients who wear a continuous glucose monitor MUST remove the device prior to scanning. FEMALES- please wear underwire-free bra if available, avoid dresses & tight clothing      After the Test: Drink plenty of water. After receiving IV contrast, you may experience a mild flushed feeling. This is normal. On occasion, you may experience a mild rash up to 24 hours after the test. This is not dangerous. If this occurs, you can take Benadryl 25 mg, Zyrtec, Claritin, or Allegra and increase your fluid intake. (Patients taking Tikosyn should avoid Benadryl, and may take Zyrtec, Claritin, or Allegra) If you experience trouble breathing, this can be serious. If it is severe call [phone] IMMEDIATELY. If it is mild, please call our office.  We will call to schedule your test 2-4 weeks out understanding that some insurance companies will need an authorization prior to the service being performed.   For more information and frequently asked questions, please visit our website :  [email or web address]  For non-scheduling related questions, please contact the cardiac imaging nurse navigator should you have any questions/concerns: Cardiac Imaging Nurse Navigators Direct Office Dial: [phone]   For scheduling needs, including cancellations and rescheduling, please call  [facility], [phone].   Follow-Up: Your physician recommends that you schedule a follow-up appointment in: 1 year. You will receive a reminder call in about 8 months reminding you to schedule your appointment. If you don't receive this call, please contact our office.   Any Other Special Instructions Will Be Listed Below (If Applicable). Thank you for choosing [facility]!     If you need a refill on your cardiac medications before your next appointment, please call your pharmacy.

## 2024-08-16 NOTE — Progress Notes (Signed)
 Cardiology Office Note  Date: 08/16/2024   ID: Samina Weekes, DOB 1951-04-06, MRN [id number]  PCP:  Jolinda Norene HERO, DO  Cardiologist:  Whitleigh Garramone P Zimere Dunlevy, MD Electrophysiologist:  None   History of Present Illness: Kristine Keller is a 73 y.o. female  Here for follow-up of abnormal stress test and elevated coronary calcium  score.  I reviewed echocardiogram and stress test findings with the patient today.  I answered all her questions.  Does not have angina.  Previously had DOE but that resolved now.  Very active at baseline.  No dizziness, palpitations, leg swelling or syncope.  Past Medical History:  Diagnosis Date   Pure hypercholesterolemia     Past Surgical History:  Procedure Laterality Date   ABDOMINAL HYSTERECTOMY      Current Outpatient Medications  Medication Sig Dispense Refill   aspirin EC 81 MG tablet Take 81 mg by mouth daily. Swallow whole.     estradiol  (ESTRACE ) 0.1 MG/GM vaginal cream FINGER APPLICATION TO THE VAGINA AT BEDTIME AS DIRECTED 42.5 g PRN   Multiple Vitamin (MULTIVITAMIN) tablet Take 1 tablet by mouth daily.     Omega-3 Fatty Acids (FISH OIL) 1200 MG CAPS Take 1 capsule by mouth daily.     Probiotic Product (PRO-BIOTIC BLEND PO) Take by mouth.     rosuvastatin  (CRESTOR ) 10 MG tablet Take 1 tablet (10 mg total) by mouth at bedtime. For cholesterol 90 tablet 3   No current facility-administered medications for this visit.   Allergies:  Codeine and Sulfa antibiotics   Social History: The patient  reports that she has never smoked. She has never used smokeless tobacco. She reports that she does not drink alcohol and does not use drugs.   Family History: The patient's family history includes Autoimmune disease in her daughter; COPD in her brother and father; Cancer in her brother and mother; Hypertension in her brother and mother.   ROS:  Please see the history of present illness. Otherwise, complete review of systems is positive for none   All other systems are reviewed and negative.   Physical Exam: VS:  BP (!) 152/82 (BP Location: Right Arm, Cuff Size: Normal)   Pulse 69   Ht 5' 3 (1.6 m)   Wt 146 lb (66.2 kg)   SpO2 96%   BMI 25.86 kg/m , BMI Body mass index is 25.86 kg/m.  Wt Readings from Last 3 Encounters:  08/16/24 146 lb (66.2 kg)  07/04/24 142 lb 12.8 oz (64.8 kg)  02/20/24 146 lb 9.6 oz (66.5 kg)    General: Patient appears comfortable at rest. HEENT: Conjunctiva and lids normal, oropharynx clear with moist mucosa. Neck: Supple, no elevated JVP or carotid bruits, no thyromegaly. Lungs: Clear to auscultation, nonlabored breathing at rest. Cardiac: Regular rate and rhythm, no S3 or significant systolic murmur, no pericardial rub. Abdomen: Soft, nontender, no hepatomegaly, bowel sounds present, no guarding or rebound. Extremities: No pitting edema, distal pulses 2+. Skin: Warm and dry. Musculoskeletal: No kyphosis. Neuropsychiatric: Alert and oriented x3, affect grossly appropriate.  Recent Labwork: 11/07/2023: ALT 20; AST 27; BUN 11; Creatinine, Ser 0.80; Hemoglobin 14.1; Platelets 168; Potassium 4.2; Sodium 140; TSH 1.420     Component Value Date/Time   CHOL 228 (H) 11/07/2023 1020   TRIG 115 11/07/2023 1020   HDL 51 11/07/2023 1020   CHOLHDL 4.5 (H) 11/07/2023 1020   LDLCALC 156 (H) 11/07/2023 1020    Assessment and Plan:  Elevated coronary calcium  score: Coronary calcium  score is 261 (  84th percentile for age and sex matched control).  Continue aspirin 81 mg once daily, rosuvastatin  10 mg nightly.  Goal LDL less than 70.  Prior LDL from March 2025 was 156.  Obtain lipid panel.  Echo normal.  Lexiscan  abnormal.  Will obtain CT cardiac.  She wants this to be scheduled in [place].  Discussed symptoms of MI and CAD with the patient.  Abnormal cardiac stress test: Stress test was obtained due to prior symptoms of DOE that is resolved now.  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  Overall, low risk study.  Asymptomatic now.  Obtain CT cardiac.  She wants this to be scheduled in [place].  HLD, not at goal: LDL 156 in March 2025, elevated.  TG 115, normal.  Goal LDL should be less than 70 due to elevated calcium  score for her age.  Continue rosuvastatin  10 mg nightly.  Obtain lipid panel.   30-minute spent in reviewing prior medical records, more than 3 labs, discussion and documentation  Medication Adjustments/Labs and Tests Ordered: Current medicines are reviewed at length with the patient today.  Concerns regarding medicines are outlined above.    Disposition:  Follow up 1 year  Signed Laylia Mui Priya Louie Flenner, MD, 08/16/2024 9:37 AM    [facility] at [facility] [address] [place], [place], [place] [address]

## 2024-08-21 ENCOUNTER — Other Ambulatory Visit

## 2024-08-22 LAB — BASIC METABOLIC PANEL WITH GFR
BUN/Creatinine Ratio: 15 (ref 12–28)
BUN: 13 mg/dL (ref 8–27)
CO2: 22 mmol/L (ref 20–29)
Calcium: 9.2 mg/dL (ref 8.7–10.3)
Chloride: 105 mmol/L (ref 96–106)
Creatinine, Ser: 0.86 mg/dL (ref 0.57–1.00)
Glucose: 90 mg/dL (ref 70–99)
Potassium: 4.1 mmol/L (ref 3.5–5.2)
Sodium: 142 mmol/L (ref 134–144)
eGFR: 72 mL/min/1.73

## 2024-08-22 LAB — LIPID PANEL
Chol/HDL Ratio: 2.7 ratio (ref 0.0–4.4)
Cholesterol, Total: 150 mg/dL (ref 100–199)
HDL: 56 mg/dL
LDL Chol Calc (NIH): 78 mg/dL (ref 0–99)
Triglycerides: 82 mg/dL (ref 0–149)
VLDL Cholesterol Cal: 16 mg/dL (ref 5–40)

## 2024-09-03 ENCOUNTER — Ambulatory Visit: Payer: Self-pay | Admitting: Internal Medicine

## 2024-09-03 ENCOUNTER — Ambulatory Visit (HOSPITAL_COMMUNITY)
Admission: RE | Admit: 2024-09-03 | Discharge: 2024-09-03 | Disposition: A | Discharge status: Home / Self Care | Source: Ambulatory Visit | Attending: Internal Medicine | Admitting: Internal Medicine

## 2024-09-03 DIAGNOSIS — R079 Chest pain, unspecified: Secondary | ICD-10-CM | POA: Diagnosis not present

## 2024-09-03 DIAGNOSIS — I2510 Atherosclerotic heart disease of native coronary artery without angina pectoris: Secondary | ICD-10-CM | POA: Diagnosis not present

## 2024-09-03 DIAGNOSIS — R9439 Abnormal result of other cardiovascular function study: Secondary | ICD-10-CM | POA: Diagnosis not present

## 2024-09-03 MED ORDER — IOHEXOL 350 MG/ML SOLN
100.0000 mL | Freq: Once | INTRAVENOUS | Status: AC | PRN
  Administered 2024-09-03: 100 mL via INTRAVENOUS

## 2024-09-03 MED ORDER — NITROGLYCERIN 0.4 MG SL SUBL
0.8000 mg | SUBLINGUAL_TABLET | Freq: Once | SUBLINGUAL | Status: AC
  Administered 2024-09-03: 0.8 mg via SUBLINGUAL

## 2024-09-04 NOTE — Telephone Encounter (Signed)
-----   Message from Vishnu Mallipeddi, MD sent at 09/03/2024  1:46 PM EST ----- Coronary calcium  score is 120, 70 th percentile for age and sex matched control.  Mild CAD is present.  Unlikely to cause DOE.  Stress test was false positive.  Continue current medications.

## 2024-09-04 NOTE — Telephone Encounter (Signed)
 The patient has been notified of the result and verbalized understanding.  All questions (if any) were answered. Littie CHRISTELLA Croak, CMA 09/04/2024 9:22 AM

## 2024-09-14 ENCOUNTER — Telehealth: Payer: Self-pay | Admitting: Family Medicine

## 2024-09-14 NOTE — Telephone Encounter (Deleted)
 Informed patient that all labs were in normal range

## 2024-09-14 NOTE — Telephone Encounter (Signed)
 Informed patient that all labs were in normal range

## 2024-09-14 NOTE — Telephone Encounter (Signed)
 Copied from CRM [id number]. Topic: Clinical - Lab/Test Results >> Sep 14, 2024  3:58 PM Montie POUR wrote: Reason for CRM:  Ms. Hedtke is calling to get her lab results from 08/21/24. I let her know no doctor has reviewed her lab results. Please call her at [phone] after the doctor reviews. Thanks

## 2024-09-17 ENCOUNTER — Telehealth: Payer: Self-pay | Admitting: Family Medicine

## 2024-09-17 ENCOUNTER — Other Ambulatory Visit: Payer: Self-pay | Admitting: Family Medicine

## 2024-09-17 ENCOUNTER — Ambulatory Visit: Payer: Self-pay | Admitting: Family Medicine

## 2024-09-17 DIAGNOSIS — R931 Abnormal findings on diagnostic imaging of heart and coronary circulation: Secondary | ICD-10-CM

## 2024-09-17 DIAGNOSIS — E7800 Pure hypercholesterolemia, unspecified: Secondary | ICD-10-CM

## 2024-09-17 NOTE — Telephone Encounter (Signed)
 Copied from CRM [id number]. Topic: General - Other >> Sep 17, 2024  2:41 PM Delon DASEN wrote: Reason for CRM: Patient needs to speak with Suzen about [place]- please call [phone]

## 2024-09-17 NOTE — Telephone Encounter (Signed)
 Please see results for CT cardiac scoring, this has been addressed.

## 2024-09-18 ENCOUNTER — Telehealth: Payer: Self-pay | Admitting: Family Medicine

## 2024-09-18 DIAGNOSIS — K449 Diaphragmatic hernia without obstruction or gangrene: Secondary | ICD-10-CM

## 2024-09-18 NOTE — Telephone Encounter (Signed)
 Copied from CRM [id number]. Topic: Referral - Request for Referral >> Sep 18, 2024  8:16 AM Montie POUR wrote: Did the patient discuss referral with their provider in the last year? Yes (If No - schedule appointment) (If Yes - send message)  Appointment offered? No  Type of order/referral and detailed reason for visit: Results from CT Cardiac Scoring. Notes are in chart and Ms. Korell wants to see a Solicitor.  Preference of office, provider, location: [facility] at [facility];  [address], [place], [place] [address] Phone: [phone]   If referral order, have you been seen by this specialty before? No (If Yes, this issue or another issue? When? Where?  Can we respond through MyChart? No

## 2024-09-18 NOTE — Telephone Encounter (Signed)
 Referral placed to patients preferred location.

## 2024-09-20 ENCOUNTER — Ambulatory Visit: Payer: Self-pay

## 2024-09-20 VITALS — BP 164/90 | HR 69 | Ht 63.0 in | Wt 146.0 lb

## 2024-09-20 DIAGNOSIS — Z0000 Encounter for general adult medical examination without abnormal findings: Secondary | ICD-10-CM | POA: Diagnosis not present

## 2024-09-20 NOTE — Progress Notes (Signed)
 "  Chief Complaint  Patient presents with   Medicare Wellness     Subjective:   Kristine Keller is a 74 y.o. female who presents for a Medicare Annual Wellness Visit.  Visit info / Clinical Intake: Medicare Wellness Visit Type:: Subsequent Annual Wellness Visit Persons participating in visit and providing information:: patient Medicare Wellness Visit Mode:: Telephone If telephone:: video declined Since this visit was completed virtually, some vitals may be partially provided or unavailable. Missing vitals are due to the limitations of the virtual format.: Unable to obtain vitals - no equipment If Telephone or Video please confirm:: I connected with patient using audio/video enable telemedicine. I verified patient identity with two identifiers, discussed telehealth limitations, and patient agreed to proceed. Patient Location:: home Provider Location:: office Interpreter Needed?: No Pre-visit prep was completed: yes AWV questionnaire completed by patient prior to visit?: no Living arrangements:: lives with spouse/significant other Patient's Overall Health Status Rating: very good Typical amount of pain: none Does pain affect daily life?: no Are you currently prescribed opioids?: no  Dietary Habits and Nutritional Risks How many meals a day?: 3 Eats fruit and vegetables daily?: yes Most meals are obtained by: preparing own meals In the last 2 weeks, have you had any of the following?: none Diabetic:: no  Functional Status Activities of Daily Living (to include ambulation/medication): Independent Ambulation: Independent Medication Administration: Independent Home Management (perform basic housework or laundry): Independent Manage your own finances?: yes Primary transportation is: family / friends Concerns about vision?: no *vision screening is required for WTM* (last ov been 15yrs ago?) Concerns about hearing?: no  Fall Screening Falls in the past year?: 0 Number of falls in  past year: 0 Was there an injury with Fall?: 0 Fall Risk Category Calculator: 0 Patient Fall Risk Level: Low Fall Risk  Fall Risk Patient at Risk for Falls Due to: No Fall Risks Fall risk Follow up: Falls evaluation completed; Education provided  Home and Transportation Safety: All rugs have non-skid backing?: yes All stairs or steps have railings?: yes Grab bars in the bathtub or shower?: (!) no Have non-skid surface in bathtub or shower?: yes Good home lighting?: yes Regular seat belt use?: yes Hospital stays in the last year:: no  Cognitive Assessment Difficulty concentrating, remembering, or making decisions? : no Will 6CIT or Mini Cog be Completed: yes What year is it?: 0 points What month is it?: 0 points Give patient an address phrase to remember (5 components): [address] [place] About what time is it?: 0 points Count backwards from 20 to 1: 0 points Say the months of the year in reverse: 0 points Repeat the address phrase from earlier: 0 points 6 CIT Score: 0 points  Advance Directives (For Healthcare) Does Patient Have a Medical Advance Directive?: No Would patient like information on creating a medical advance directive?: No - Patient declined  Reviewed/Updated  Reviewed/Updated: Reviewed All (Medical, Surgical, Family, Medications, Allergies, Care Teams, Patient Goals); Medical History; Surgical History; Family History; Medications; Allergies; Care Teams; Patient Goals    Allergies (verified) Codeine and Sulfa antibiotics   Current Medications (verified) Outpatient Encounter Medications as of 09/20/2024  Medication Sig   aspirin EC 81 MG tablet Take 81 mg by mouth daily. Swallow whole.   estradiol  (ESTRACE ) 0.1 MG/GM vaginal cream FINGER APPLICATION TO THE VAGINA AT BEDTIME AS DIRECTED   metoprolol  tartrate (LOPRESSOR ) 100 MG tablet Take 1 tablet (100 mg total) by mouth once. 2 hours prior to procedure   Multiple  Vitamin (MULTIVITAMIN) tablet Take 1  tablet by mouth daily.   Omega-3 Fatty Acids (FISH OIL) 1200 MG CAPS Take 1 capsule by mouth daily.   Probiotic Product (PRO-BIOTIC BLEND PO) Take by mouth.   rosuvastatin  (CRESTOR ) 10 MG tablet Take 1 tablet (10 mg total) by mouth at bedtime. For cholesterol   No facility-administered encounter medications on file as of 09/20/2024.    History: Past Medical History:  Diagnosis Date   Pure hypercholesterolemia    Past Surgical History:  Procedure Laterality Date   ABDOMINAL HYSTERECTOMY     Family History  Problem Relation Age of Onset   Cancer Mother        lung, kidney, cervical   Hypertension Mother    COPD Father    Autoimmune disease Daughter    COPD Brother    Hypertension Brother    Cancer Brother        kidney   Breast cancer Neg Hx    Social History   Occupational History   Occupation: retired  Tobacco Use   Smoking status: Never   Smokeless tobacco: Never  Vaping Use   Vaping status: Never Used  Substance and Sexual Activity   Alcohol use: Never   Drug use: Never   Sexual activity: Not Currently   Tobacco Counseling Counseling given: Yes  SDOH Screenings   Food Insecurity: No Food Insecurity (09/20/2024)  Housing: Unknown (09/20/2024)  Transportation Needs: No Transportation Needs (09/20/2024)  Utilities: Not At Risk (09/20/2024)  Alcohol Screen: Low Risk (09/20/2023)  Depression (PHQ2-9): Low Risk (09/20/2024)  Financial Resource Strain: Low Risk (09/20/2023)  Physical Activity: Sufficiently Active (09/20/2024)  Social Connections: Moderately Isolated (09/20/2024)  Stress: No Stress Concern Present (09/20/2024)  Tobacco Use: Low Risk (09/20/2024)  Health Literacy: Adequate Health Literacy (09/20/2024)   See flowsheets for full screening details  Depression Screen PHQ 2 & 9 Depression Scale- Over the past 2 weeks, how often have you been bothered by any of the following problems? Little interest or pleasure in doing things: 0 Feeling down, depressed, or  hopeless (PHQ Adolescent also includes...irritable): 0 PHQ-2 Total Score: 0 Trouble falling or staying asleep, or sleeping too much: 0 Feeling tired or having little energy: 0 Poor appetite or overeating (PHQ Adolescent also includes...weight loss): 0 Feeling bad about yourself - or that you are a failure or have let yourself or your family down: 0 Trouble concentrating on things, such as reading the newspaper or watching television (PHQ Adolescent also includes...like school work): 0 Moving or speaking so slowly that other people could have noticed. Or the opposite - being so fidgety or restless that you have been moving around a lot more than usual: 0 Thoughts that you would be better off dead, or of hurting yourself in some way: 0 PHQ-9 Total Score: 0 If you checked off any problems, how difficult have these problems made it for you to do your work, take care of things at home, or get along with other people?: Not difficult at all     Goals Addressed             This Visit's Progress    Remain active and independent       Watch grandkids-keeps pt active/decorating/reading/walking outside/sit w/neighbors             Objective:    Today's Vitals   09/20/24 0842  BP: (!) 164/90  Pulse: 69  Weight: 146 lb (66.2 kg)  Height: 5' 3 (1.6 m)   Body  mass index is 25.86 kg/m.  Hearing/Vision screen No results found. Immunizations and Health Maintenance Health Maintenance  Topic Date Due   Zoster Vaccines- Shingrix (1 of 2) Never done   COVID-19 Vaccine (3 - 2025-26 season) 04/30/2024   Mammogram  10/25/2024   DTaP/Tdap/Td (1 - Tdap) 11/06/2024 (Originally 02/05/1970)   Pneumococcal Vaccine: 50+ Years (1 of 1 - PCV) 11/06/2024 (Originally 02/04/2002)   Bone Density Scan  11/06/2024 (Originally 02/04/2017)   Colonoscopy  11/06/2024 (Originally 03/06/2023)   Influenza Vaccine  11/27/2024 (Originally 03/30/2024)   Medicare Annual Wellness (AWV)  09/20/2025   Hepatitis C Screening   Completed   Meningococcal B Vaccine  Aged Out        Assessment/Plan:  This is a routine wellness examination for Kristine Keller.  Patient Care Team: Jolinda Norene HERO, DO as PCP - General (Family Medicine) Mallipeddi, Diannah SQUIBB, MD as PCP - Cardiology (Cardiology)  I have personally reviewed and noted the following in the patients chart:   Medical and social history Use of alcohol, tobacco or illicit drugs  Current medications and supplements including opioid prescriptions. Functional ability and status Nutritional status Physical activity Advanced directives List of other physicians Hospitalizations, surgeries, and ER visits in previous 12 months Vitals Screenings to include cognitive, depression, and falls Referrals and appointments  No orders of the defined types were placed in this encounter.  In addition, I have reviewed and discussed with patient certain preventive protocols, quality metrics, and best practice recommendations. A written personalized care plan for preventive services as well as general preventive health recommendations were provided to patient.   Kristine Keller, CMA   09/20/2024   Return in 1 year (on 09/20/2025).  After Visit Summary: (MyChart) Due to this being a telephonic visit, the after visit summary with patients personalized plan was offered to patient via MyChart   Nurse Notes: Pt is aware and the following: Per pt don't do any vaccines or the Dexa. Pt rec' mammogram ltr to remind pt to make an apt--will make apt as soon as she can.  Pt denies any other concerns.  "

## 2024-09-20 NOTE — Patient Instructions (Signed)
 Kristine Keller,  Thank you for taking the time for your Medicare Wellness Visit. I appreciate your continued commitment to your health goals. Please review the care plan we discussed, and feel free to reach out if I can assist you further.  Please note that Annual Wellness Visits do not include a physical exam. Some assessments may be limited, especially if the visit was conducted virtually. If needed, we may recommend an in-person follow-up with your provider.  Ongoing Care Seeing your primary care provider every 3 to 6 months helps us  monitor your health and provide consistent, personalized care.   Referrals If a referral was made during today's visit and you haven't received any updates within two weeks, please contact the referred provider directly to check on the status.  Recommended Screenings:  Health Maintenance  Topic Date Due   Zoster (Shingles) Vaccine (1 of 2) Never done   COVID-19 Vaccine (3 - 2025-26 season) 04/30/2024   Medicare Annual Wellness Visit  09/19/2024   Breast Cancer Screening  10/25/2024   DTaP/Tdap/Td vaccine (1 - Tdap) 11/06/2024*   Pneumococcal Vaccine for age over 78 (1 of 1 - PCV) 11/06/2024*   Osteoporosis screening with Bone Density Scan  11/06/2024*   Colon Cancer Screening  11/06/2024*   Flu Shot  11/27/2024*   Hepatitis C Screening  Completed   Meningitis B Vaccine  Aged Out  *Topic was postponed. The date shown is not the original due date.       09/20/2024    8:48 AM  Advanced Directives  Does Patient Have a Medical Advance Directive? No  Would patient like information on creating a medical advance directive? No - Patient declined    Vision: Annual vision screenings are recommended for early detection of glaucoma, cataracts, and diabetic retinopathy. These exams can also reveal signs of chronic conditions such as diabetes and high blood pressure.  Dental: Annual dental screenings help detect early signs of oral cancer, gum disease, and other  conditions linked to overall health, including heart disease and diabetes.  Please see the attached documents for additional preventive care recommendations.

## 2024-09-24 ENCOUNTER — Ambulatory Visit (INDEPENDENT_AMBULATORY_CARE_PROVIDER_SITE_OTHER): Admitting: Gastroenterology

## 2024-10-16 ENCOUNTER — Ambulatory Visit (INDEPENDENT_AMBULATORY_CARE_PROVIDER_SITE_OTHER): Admitting: Gastroenterology

## 2024-12-19 ENCOUNTER — Ambulatory Visit

## 2025-09-23 ENCOUNTER — Ambulatory Visit
